# Patient Record
Sex: Female | Born: 1975 | Race: White | Hispanic: No | Marital: Married | Smoking: Never smoker
Health system: Southern US, Community
[De-identification: ages and names within clinical notes are randomized; demographics above are authoritative.]

## PROBLEM LIST (undated history)

## (undated) DIAGNOSIS — F329 Major depressive disorder, single episode, unspecified: Secondary | ICD-10-CM

## (undated) DIAGNOSIS — D649 Anemia, unspecified: Secondary | ICD-10-CM

## (undated) DIAGNOSIS — F3289 Other specified depressive episodes: Secondary | ICD-10-CM

## (undated) DIAGNOSIS — E7800 Pure hypercholesterolemia, unspecified: Secondary | ICD-10-CM

## (undated) DIAGNOSIS — K219 Gastro-esophageal reflux disease without esophagitis: Secondary | ICD-10-CM

## (undated) HISTORY — DX: Pure hypercholesterolemia, unspecified: E78.00

## (undated) HISTORY — PX: RHINOPLASTY: SUR1284

## (undated) HISTORY — PX: OTHER SURGICAL HISTORY: SHX169

## (undated) HISTORY — DX: Other specified depressive episodes: F32.89

## (undated) HISTORY — DX: Anemia, unspecified: D64.9

## (undated) HISTORY — DX: Major depressive disorder, single episode, unspecified: F32.9

## (undated) HISTORY — DX: Gastro-esophageal reflux disease without esophagitis: K21.9

---
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
## 2000-07-22 ENCOUNTER — Other Ambulatory Visit: Admission: RE | Admit: 2000-07-22 | Discharge: 2000-07-22 | Payer: Self-pay | Admitting: *Deleted

## 2000-10-22 ENCOUNTER — Encounter: Payer: Self-pay | Admitting: Family Medicine

## 2000-11-18 ENCOUNTER — Ambulatory Visit (HOSPITAL_COMMUNITY): Admission: RE | Admit: 2000-11-18 | Discharge: 2000-11-18 | Payer: Self-pay | Admitting: Gastroenterology

## 2000-11-18 ENCOUNTER — Encounter: Payer: Self-pay | Admitting: Family Medicine

## 2001-07-12 ENCOUNTER — Ambulatory Visit (HOSPITAL_COMMUNITY): Admission: RE | Admit: 2001-07-12 | Discharge: 2001-07-12 | Payer: Self-pay | Admitting: Family Medicine

## 2001-07-12 ENCOUNTER — Encounter: Payer: Self-pay | Admitting: Family Medicine

## 2001-08-17 ENCOUNTER — Other Ambulatory Visit: Admission: RE | Admit: 2001-08-17 | Discharge: 2001-08-17 | Payer: Self-pay | Admitting: *Deleted

## 2002-08-02 ENCOUNTER — Encounter: Admission: RE | Admit: 2002-08-02 | Discharge: 2002-08-02 | Payer: Self-pay | Admitting: Gastroenterology

## 2002-08-02 ENCOUNTER — Encounter: Payer: Self-pay | Admitting: Gastroenterology

## 2002-08-02 ENCOUNTER — Encounter: Payer: Self-pay | Admitting: Family Medicine

## 2002-09-16 ENCOUNTER — Other Ambulatory Visit: Admission: RE | Admit: 2002-09-16 | Discharge: 2002-09-16 | Payer: Self-pay | Admitting: *Deleted

## 2003-04-02 ENCOUNTER — Emergency Department (HOSPITAL_COMMUNITY): Admission: EM | Admit: 2003-04-02 | Discharge: 2003-04-02 | Payer: Self-pay | Admitting: Emergency Medicine

## 2003-09-27 ENCOUNTER — Other Ambulatory Visit: Admission: RE | Admit: 2003-09-27 | Discharge: 2003-09-27 | Payer: Self-pay | Admitting: *Deleted

## 2003-11-03 ENCOUNTER — Encounter: Payer: Self-pay | Admitting: Family Medicine

## 2005-03-01 ENCOUNTER — Encounter (INDEPENDENT_AMBULATORY_CARE_PROVIDER_SITE_OTHER): Payer: Self-pay | Admitting: Specialist

## 2005-03-01 ENCOUNTER — Inpatient Hospital Stay (HOSPITAL_COMMUNITY): Admission: AD | Admit: 2005-03-01 | Discharge: 2005-03-04 | Payer: Self-pay | Admitting: *Deleted

## 2007-09-22 ENCOUNTER — Inpatient Hospital Stay (HOSPITAL_COMMUNITY): Admission: RE | Admit: 2007-09-22 | Discharge: 2007-09-25 | Payer: Self-pay | Admitting: *Deleted

## 2008-02-10 ENCOUNTER — Encounter: Admission: RE | Admit: 2008-02-10 | Discharge: 2008-02-10 | Payer: Self-pay | Admitting: Family Medicine

## 2008-02-16 ENCOUNTER — Encounter: Admission: RE | Admit: 2008-02-16 | Discharge: 2008-02-16 | Payer: Self-pay | Admitting: Family Medicine

## 2008-12-05 ENCOUNTER — Ambulatory Visit: Payer: Self-pay | Admitting: Family Medicine

## 2008-12-05 DIAGNOSIS — K219 Gastro-esophageal reflux disease without esophagitis: Secondary | ICD-10-CM

## 2008-12-05 DIAGNOSIS — E7800 Pure hypercholesterolemia, unspecified: Secondary | ICD-10-CM

## 2008-12-05 DIAGNOSIS — Z8659 Personal history of other mental and behavioral disorders: Secondary | ICD-10-CM

## 2009-01-15 ENCOUNTER — Telehealth: Payer: Self-pay | Admitting: Family Medicine

## 2009-03-14 ENCOUNTER — Other Ambulatory Visit: Admission: RE | Admit: 2009-03-14 | Discharge: 2009-03-14 | Payer: Self-pay | Admitting: Family Medicine

## 2009-03-14 ENCOUNTER — Ambulatory Visit: Payer: Self-pay | Admitting: Family Medicine

## 2009-03-14 ENCOUNTER — Encounter: Payer: Self-pay | Admitting: Family Medicine

## 2009-03-19 LAB — CONVERTED CEMR LAB
ALT: 9 units/L (ref 0–35)
AST: 18 units/L (ref 0–37)
Alkaline Phosphatase: 63 units/L (ref 39–117)
Basophils Relative: 0.1 % (ref 0.0–3.0)
Bilirubin, Direct: 0 mg/dL (ref 0.0–0.3)
Chloride: 102 meq/L (ref 96–112)
Cholesterol: 207 mg/dL — ABNORMAL HIGH (ref 0–200)
Creatinine, Ser: 0.9 mg/dL (ref 0.4–1.2)
Eosinophils Relative: 1.1 % (ref 0.0–5.0)
Lymphocytes Relative: 35.1 % (ref 12.0–46.0)
MCV: 92.7 fL (ref 78.0–100.0)
Monocytes Absolute: 0.3 10*3/uL (ref 0.1–1.0)
Monocytes Relative: 6.5 % (ref 3.0–12.0)
Neutrophils Relative %: 57.2 % (ref 43.0–77.0)
RBC: 4.6 M/uL (ref 3.87–5.11)
Total CHOL/HDL Ratio: 4
Total Protein: 7.3 g/dL (ref 6.0–8.3)
VLDL: 16.6 mg/dL (ref 0.0–40.0)
WBC: 5.3 10*3/uL (ref 4.5–10.5)

## 2009-03-20 ENCOUNTER — Encounter (INDEPENDENT_AMBULATORY_CARE_PROVIDER_SITE_OTHER): Payer: Self-pay | Admitting: *Deleted

## 2009-06-14 ENCOUNTER — Ambulatory Visit: Payer: Self-pay | Admitting: Family Medicine

## 2009-07-24 ENCOUNTER — Ambulatory Visit: Payer: Self-pay | Admitting: Family Medicine

## 2009-07-26 ENCOUNTER — Telehealth: Payer: Self-pay | Admitting: Family Medicine

## 2009-08-29 ENCOUNTER — Ambulatory Visit: Payer: Self-pay | Admitting: Family Medicine

## 2009-12-13 ENCOUNTER — Telehealth: Payer: Self-pay | Admitting: Family Medicine

## 2010-01-28 ENCOUNTER — Ambulatory Visit: Payer: Self-pay | Admitting: Family Medicine

## 2010-03-12 ENCOUNTER — Telehealth (INDEPENDENT_AMBULATORY_CARE_PROVIDER_SITE_OTHER): Payer: Self-pay | Admitting: *Deleted

## 2010-03-13 ENCOUNTER — Ambulatory Visit: Payer: Self-pay | Admitting: Family Medicine

## 2010-03-17 LAB — CONVERTED CEMR LAB
ALT: 11 units/L (ref 0–35)
AST: 17 units/L (ref 0–37)
Albumin: 4.2 g/dL (ref 3.5–5.2)
Alkaline Phosphatase: 65 units/L (ref 39–117)
Eosinophils Relative: 1.4 % (ref 0.0–5.0)
GFR calc non Af Amer: 94.75 mL/min (ref >60)
HCT: 40.5 % (ref 36.0–46.0)
Hemoglobin: 14.1 g/dL (ref 12.0–15.0)
LDL Cholesterol: 113 mg/dL — ABNORMAL HIGH (ref 0–99)
Lymphocytes Relative: 23 % (ref 12.0–46.0)
Lymphs Abs: 1.6 10*3/uL (ref 0.7–4.0)
Monocytes Relative: 7.5 % (ref 3.0–12.0)
Neutro Abs: 4.6 10*3/uL (ref 1.4–7.7)
Potassium: 4.4 meq/L (ref 3.5–5.1)
Sodium: 139 meq/L (ref 135–145)
TSH: 1.15 microintl units/mL (ref 0.35–5.50)
Total Bilirubin: 0.7 mg/dL (ref 0.3–1.2)
Total CHOL/HDL Ratio: 4
VLDL: 19.8 mg/dL (ref 0.0–40.0)
WBC: 6.7 10*3/uL (ref 4.5–10.5)

## 2010-04-02 LAB — HM PAP SMEAR: HM Pap smear: NORMAL

## 2010-04-08 ENCOUNTER — Encounter: Payer: Self-pay | Admitting: Family Medicine

## 2010-04-16 ENCOUNTER — Ambulatory Visit: Payer: Self-pay | Admitting: Family Medicine

## 2010-04-24 ENCOUNTER — Ambulatory Visit: Payer: Self-pay | Admitting: Family Medicine

## 2010-04-24 ENCOUNTER — Other Ambulatory Visit
Admission: RE | Admit: 2010-04-24 | Discharge: 2010-04-24 | Payer: Self-pay | Source: Home / Self Care | Admitting: Family Medicine

## 2010-05-06 ENCOUNTER — Encounter: Payer: Self-pay | Admitting: Family Medicine

## 2010-07-02 NOTE — Progress Notes (Signed)
Summary: zoloft   Phone Note Call from Patient Call back at Home Phone [phone]   Caller: Patient Call For: Judith Part MD Summary of Call: Patient called to see if she could switch from prozac to zoloft. The prozac was removed from her med list on 07-24-09. She said that she stopped taking it because she didn't like the way if made her feel, (she says it was numming). She wants to try generic zoloft. [facility] [address]  Initial call taken by: Melody Comas,  December 13, 2009 10:46 AM  Follow-up for Phone Call        that is fine  px written on EMR for call in  please stop med if more depressed or anxious (this is rare) warn her this may be a bit more sedating than prozac f/u with me in about 6 wk Follow-up by: Judith Part MD,  December 13, 2009 12:41 PM  Additional Follow-up for Phone Call Additional follow up Details #1::        Patient notified as instructed by telephone. Medication phoned to [facility] [employer] as instructed. Pt scheduled appt with Dr Milinda Antis 01/28/10 at 3pm.[facility] LPN  December 13, 2009 12:54 PM     New/Updated Medications: ZOLOFT 50 MG TABS (SERTRALINE HCL) 1/2 by mouth once daily in eve for 2 weeks then increase to 1 by mouth once daily in eve Prescriptions: ZOLOFT 50 MG TABS (SERTRALINE HCL) 1/2 by mouth once daily in eve for 2 weeks then increase to 1 by mouth once daily in eve  #30 x 5   Entered and Authorized by:   Judith Part MD   Signed by:   Lewanda Rife LPN on 09/81/1914   Method used:   Telephoned to ...       [facility] [address] [phone]* (retail)       [address], [place]  [address]       Ph: [phone]       Fax: [phone]   RxID:   [id number]

## 2010-07-02 NOTE — Progress Notes (Signed)
Summary: Rx for cough  Phone Note Call from Patient Call back at Home Phone [phone] Call back at [phone] cell   Caller: Patient Call For: Judith Part MD Summary of Call: Patient was seen on Tuesday by Dr. Ermalene Searing and was told that if no better to call back and she would prescribe her a cough medicine.  Dr. Ermalene Searing is not here today but advised patient that I would send the note to her PCP to see if that could be taken care of.  Uses [facility] [phone]. Initial call taken by: Linde Gillis CMA Duncan Dull),  July 26, 2009 8:15 AM  Follow-up for Phone Call        looks like she gets nausea with codine so want to avoid that  px written on EMR for call in - tessalon let me know if not helpful or if symptoms worsen Follow-up by: Judith Part MD,  July 26, 2009 8:40 AM  Additional Follow-up for Phone Call Additional follow up Details #1::        Patient notified as instructed by telephone. Medication phoned to [employer] as instructed. Lewanda Rife LPN  July 26, 2009 11:29 AM     New/Updated Medications: TESSALON 200 MG CAPS (BENZONATATE) 1 by mouth three times a day as needed cough swallow whole Prescriptions: TESSALON 200 MG CAPS (BENZONATATE) 1 by mouth three times a day as needed cough swallow whole  #30 x 0   Entered and Authorized by:   Judith Part MD   Signed by:   Lewanda Rife LPN on 36/64/4034   Method used:   Telephoned to ...       [facility] [address] [phone]* (retail)       [address], [place]  [address]       Ph: [phone]       Fax: [phone]   RxID:   [id number]

## 2010-07-02 NOTE — Assessment & Plan Note (Signed)
Summary: CPX/W/PAP/RBH   Vital Signs:  Patient profile:   35 year old female Height:      69.25 inches Weight:      189.25 pounds BMI:     27.85 Temp:     98.4 degrees F oral Pulse rate:   92 / minute Pulse rhythm:   regular BP sitting:   114 / 70  (right arm) Cuff size:   regular  Vitals Entered By: Lewanda Rife LPN (April 24, 2010 3:07 PM) CC: CPX with pap and breast exam LMP 04/14/10   History of Present Illness: here for health mt exam with gyn exam and to disc chronic med problems   has been feeling good and taking care of herself   recent labs nl  no more anemia good lipids  LDL is 113 down from 139-- was working on eating better  not exercising -- cannot fit it in   nothing new to report   pink eye was gone the next day   bp 114/70  hx of breast ca in mother  nl pap 10/10  hx of hpv personal in past doing pap today  periods are irregular - but does not want OC  husband had a vasectomy   Td08  flu shot-- got at [facility] in october   Allergies: 1)  ! Codeine  Past History:  Past Medical History: Last updated: 12/05/2008 Anemia-NOS Depression GERD Genital Warts- HPV   GI- [facility]- Dr Earlene Plater -- [facility]   Past Surgical History: Last updated: 12/05/2008 Tonsillectomy (1985) colon flex sig 11/18/00- normal  9/09 neg abd [place]  3/04- neg hepatobiliary scan chin implant  rhinoplasty   Family History: Last updated: 12/05/2008 Family History of Alcoholism/Addiction (parent)- father  Family History of Arthritis (parent) Family History Breast cancer (parent)- mother  Family History of Colon CA  (grandparent)- MGF  Family History Diabetes (parent)- father  Family History High cholesterol (parent)- father  Family History Hypertension (parent)- father  Family History of Mental Ilness (Parent)- mother / depression  mother with degenerative arthritis   Social History: Last updated: 12/05/2008 Occupation:  Homemaker G2P2 Married Never Smoked Alcohol use-no Drug use-no Regular exercise-no  Risk Factors: Exercise: no (12/05/2008)  Risk Factors: Smoking Status: never (12/05/2008)  Review of Systems General:  Denies fatigue, fever, loss of appetite, and malaise. Eyes:  Denies blurring and eye irritation. CV:  Denies lightheadness and palpitations. Resp:  Denies cough, shortness of breath, and wheezing. GI:  Denies abdominal pain, change in bowel habits, and indigestion. GU:  Denies abnormal vaginal bleeding, discharge, dysuria, hematuria, and urinary frequency. MS:  Denies joint pain, joint redness, and joint swelling. Derm:  Denies itching, lesion(s), poor wound healing, and rash. Neuro:  Denies numbness and tingling. Psych:  Denies anxiety and depression. Endo:  Denies cold intolerance, excessive thirst, excessive urination, and heat intolerance. Heme:  Denies abnormal bruising and bleeding.  Physical Exam  General:  Well-developed,well-nourished,in no acute distress; alert,appropriate and cooperative throughout examination Head:  normocephalic, atraumatic, and no abnormalities observed.   Eyes:  vision grossly intact, pupils equal, pupils round, and pupils reactive to light.  no conjunctival pallor, injection or icterus  Ears:  R ear normal and L ear normal.   Nose:  no nasal discharge.   Mouth:  pharynx pink and moist.   Neck:  supple with full rom and no masses or thyromegally, no JVD or carotid bruit  Chest Wall:  No deformities, masses, or tenderness noted. Breasts:  No mass,  nodules, thickening, tenderness, bulging, retraction, inflamation, nipple discharge or skin changes noted.   Lungs:  Normal respiratory effort, chest expands symmetrically. Lungs are clear to auscultation, no crackles or wheezes. Heart:  Normal rate and regular rhythm. S1 and S2 normal without gallop, murmur, click, rub or other extra sounds. Abdomen:  Bowel sounds positive,abdomen soft and non-tender  without masses, organomegaly or hernias noted. no renal bruits  Genitalia:  Normal introitus for age, no external lesions, no vaginal discharge, mucosa pink and moist, no vaginal or cervical lesions, no vaginal atrophy, no friaility or hemorrhage, normal uterus size and position, no adnexal masses or tenderness Msk:  No deformity or scoliosis noted of thoracic or lumbar spine.  no acute joint changes  Pulses:  R and L carotid,radial,femoral,dorsalis pedis and posterior tibial pulses are full and equal bilaterally Extremities:  No clubbing, cyanosis, edema, or deformity noted with normal full range of motion of all joints.   Neurologic:  sensation intact to light touch, gait normal, and DTRs symmetrical and normal.   Skin:  Intact without suspicious lesions or rashes Cervical Nodes:  No lymphadenopathy noted Axillary Nodes:  No palpable lymphadenopathy Inguinal Nodes:  No significant adenopathy Psych:  normal affect, talkative and pleasant    Impression & Recommendations:  Problem # 1:  HEALTH MAINTENANCE EXAM (ICD-V70.0) Assessment Comment Only reviewed health habits including diet, exercise and skin cancer prevention reviewed health maintenance list and family history  rev labs with better cholesterol -- commended on that  utd on imms   Problem # 2:  ROUTINE GYNECOLOGICAL EXAMINATION (ICD-V72.31) Assessment: Comment Only annual exam with pap  menses are irregular - but pt is ok with that and no longer anemic  not in need of birth control   Problem # 3:  DEPRESSION (ICD-311) Assessment: Improved  overall doing well with this and zoloft  Her updated medication list for this problem includes:    Zoloft 50 Mg Tabs (Sertraline hcl) .Marland Kitchen... 1 by mouth once daily  Orders: Prescription Created Electronically [id number])  Complete Medication List: 1)  Zoloft 50 Mg Tabs (Sertraline hcl) .Marland Kitchen.. 1 by mouth once daily 2)  Multivitamins Tabs (Multiple vitamin) .... Take 1 tablet by mouth once a  day  Patient Instructions: 1)  keep up the good work with healthy diet  2)  work on incorporating exercise  3)  no change in medicines  Prescriptions: ZOLOFT 50 MG TABS (SERTRALINE HCL) 1 by mouth once daily  #30 x 11   Entered and Authorized by:   Judith Part MD   Signed by:   Judith Part MD on 04/24/2010   Method used:   Electronically to        [facility] (retail)       [address]       [place], [place]  [address]       Ph: [phone]       Fax: [phone]   RxID:   [id number]    Orders Added: 1)  Prescription Created Electronically [[id number]] 2)  Est. Patient 18-39 years [id number]    Current Allergies (reviewed today): ! CODEINE

## 2010-07-02 NOTE — Assessment & Plan Note (Signed)
Summary: 11:15AM - SORE THROAT & CONGESTION / LFW   Vital Signs:  Patient profile:   35 year old female Height:      69.25 inches Weight:      193.50 pounds BMI:     28.47 Temp:     99.0 degrees F oral Pulse rate:   72 / minute Pulse rhythm:   regular BP sitting:   108 / 76  (left arm) Cuff size:   regular  Vitals Entered By: Linde Gillis CMA Duncan Dull) (July 24, 2009 11:11 AM) CC: sore throat, congestion   Acute Visit History:      The patient complains of cough, earache, headache, and sore throat.  These symptoms began 5 days ago.  She denies fever and sinus problems.  Other comments include: chest congestion and tightness OTC using mucinex, nyquil, advil cold and sinus Children sick recently.        The character of the cough is described as nonproductive.  There is no history of wheezing or shortness of breath associated with her cough.        Earache symptom: left greater than right.        Problems Prior to Update: 1)  Other Acute Sinusitis  (ICD-461.8) 2)  Routine Gynecological Examination  (ICD-V72.31) 3)  Health Maintenance Exam  (ICD-V70.0) 4)  Hypercholesterolemia, Mild  (ICD-272.0) 5)  Sebaceous Cyst, Neck  (ICD-706.2) 6)  Gerd  (ICD-530.81) 7)  Depression  (ICD-311) 8)  Anemia-nos  (ICD-285.9) 9)  Family History Diabetes 1st Degree Relative  (ICD-V18.0) 10)  Family History of Colon Ca 1st Degree Relative <60  (ICD-V16.0) 11)  Family History Breast Cancer 1st Degree Relative <50  (ICD-V16.3) 12)  Family History of Alcoholism/addiction  (ICD-V61.41)  Current Medications (verified): 1)  None  Allergies: 1)  ! Codeine  Past History:  Past medical, surgical, family and social histories (including risk factors) reviewed, and no changes noted (except as noted below).  Past Medical History: Reviewed history from 12/05/2008 and no changes required. Anemia-NOS Depression GERD Genital Warts- HPV   GI- [facility]- Dr Earlene Plater -- [facility]   Past  Surgical History: Reviewed history from 12/05/2008 and no changes required. Tonsillectomy (1985) colon flex sig 11/18/00- normal  9/09 neg abd [place]  3/04- neg hepatobiliary scan chin implant  rhinoplasty   Family History: Reviewed history from 12/05/2008 and no changes required. Family History of Alcoholism/Addiction (parent)- father  Family History of Arthritis (parent) Family History Breast cancer (parent)- mother  Family History of Colon CA  (grandparent)- MGF  Family History Diabetes (parent)- father  Family History High cholesterol (parent)- father  Family History Hypertension (parent)- father  Family History of Mental Ilness (Parent)- mother / depression  mother with degenerative arthritis   Social History: Reviewed history from 12/05/2008 and no changes required. Occupation: Futures trader G2P2 Married Never Smoked Alcohol use-no Drug use-no Regular exercise-no  Review of Systems General:  Denies fatigue and fever. CV:  Denies chest pain or discomfort. GI:  Denies abdominal pain.  Physical Exam  General:  fatigued appearing female inNAD Ears:  clear fluid B TMS Nose:  nasal discharge, clear Mouth:  MMMpharynx pink and moist, no exudates, and no posterior lymphoid hypertrophy.   Neck:  no carotid bruit or thyromegaly no cervical or supraclavicular lymphadenopathy  Lungs:  Normal respiratory effort, chest expands symmetrically. Lungs are clear to auscultation, no crackles or wheezes. Heart:  Normal rate and regular rhythm. S1 and S2 normal without gallop, murmur, click, rub or other  extra sounds.   Impression & Recommendations:  Problem # 1:  URI (ICD-465.9)  Instructed on symptomatic treatment.  Mucinex, nsasl saline irrigation, call if cough suppressant needed. reviewed expected 7-10 day course of viral timeline. Call if symptoms persist or worsen.   Current Allergies (reviewed today): ! CODEINE

## 2010-07-02 NOTE — Letter (Signed)
Summary: Results Follow up Letter  [place] [facility]  [address] [place], [place] [address]   Phone: [phone]  Fax: [phone]    05/06/2010 MRN: [id number]    Margaret [name] [address] [place], [place]  [address]    Dear Ms. Gloeckner,  The following are the results of your recent test(s):  Test         Result    Pap Smear:        Normal __X___  Not Normal _____ Comments: ______________________________________________________ Cholesterol: LDL(Bad cholesterol):         Your goal is less than:         HDL (Good cholesterol):       Your goal is more than: Comments:  ______________________________________________________ Mammogram:        Normal _____  Not Normal _____ Comments:  ___________________________________________________________________ Hemoccult:        Normal _____  Not normal _______ Comments:    _____________________________________________________________________ Other Tests:    We routinely do not discuss normal results over the telephone.  If you desire a copy of the results, or you have any questions about this information we can discuss them at your next office visit.   Sincerely,    Idamae Schuller Tower,MD  MT/ri

## 2010-07-02 NOTE — Assessment & Plan Note (Signed)
Summary: 2:00 CONGESTION,COUGH/CLE   Vital Signs:  Patient profile:   35 year old female Height:      69.25 inches Weight:      192.13 pounds BMI:     28.27 Temp:     98.4 degrees F oral Pulse rate:   84 / minute Pulse rhythm:   regular BP sitting:   106 / 76  (left arm) Cuff size:   regular  Vitals Entered By: [name] CMA Duncan Dull) (June 14, 2009 2:01 PM) CC: Congestion   History of Present Illness: 35 yo with 2 weeks of nasal congestion, sinus pressure ears popping. she feels it has gotten worse over past few days. Trying OTC Advil cold and sinus with only mild relief of symptoms. Dry cough. No wheezing. No SOB. No rashes, no n/v/d.  Current Medications (verified): 1)  Prozac 20 Mg Caps (Fluoxetine Hcl) .... Take 1 Tablet By Mouth Once A Day 2)  Amoxicillin 500 Mg Tabs (Amoxicillin) .Marland Kitchen.. 1 Tab By Mouth Two Times A Day X 10 Days  Allergies: 1)  ! Codeine  Review of Systems      See HPI General:  Denies chills and fever. CV:  Denies chest pain or discomfort. Resp:  Denies shortness of breath, sputum productive, and wheezing.  Physical Exam  General:  NAD, sounds very congested. Nose:  nasal dischargemucosal pallor.   + frontal sinuses Mouth:  pharynx pink and moist.   Lungs:  Normal respiratory effort, chest expands symmetrically. Lungs are clear to auscultation, no crackles or wheezes. Heart:  Normal rate and regular rhythm. S1 and S2 normal without gallop, murmur, click, rub or other extra sounds. Psych:  normal affect, talkative and pleasant    Impression & Recommendations:  Problem # 1:  OTHER ACUTE SINUSITIS (ICD-461.8) Assessment New Given duration of symptoms, will treat for bacterial sinusitis. Continue supportive care.   See patient instructions for details. Her updated medication list for this problem includes:    Amoxicillin 500 Mg Tabs (Amoxicillin) .Marland Kitchen... 1 tab by mouth two times a day x 10 days  Complete Medication List: 1)  Prozac 20  Mg Caps (Fluoxetine hcl) .... Take 1 tablet by mouth once a day 2)  Amoxicillin 500 Mg Tabs (Amoxicillin) .Marland Kitchen.. 1 tab by mouth two times a day x 10 days  Patient Instructions: 1)   Drink lots of fluids.  Treat sympotmatically  with guafenesin, nasal saline irrigation, and Tylenol.  Cough suppressant at night. Call if not improving as expected in 5-7 days as on viral timeline.  Prescriptions: AMOXICILLIN 500 MG TABS (AMOXICILLIN) 1 tab by mouth two times a day x 10 days  #20 x 0   Entered and Authorized by:   Ruthe Mannan MD   Signed by:   Ruthe Mannan MD on 06/14/2009   Method used:   Electronically to        [facility]. [address] [phone]* (retail)       [address], [place]  [address]       Ph: [phone]       Fax: [phone]   RxID:   [id number]   Current Allergies (reviewed today): ! CODEINE

## 2010-07-02 NOTE — Miscellaneous (Signed)
Summary: Flu vaccine  Clinical Lists Changes  Observations: Added new observation of FLU VAX: Historical (04/04/2010 16:51)      Influenza Immunization History:    Influenza # 1:  Historical (04/04/2010) Received form from [employer] [place], [place]

## 2010-07-02 NOTE — Assessment & Plan Note (Signed)
Summary: ?Margaret [name]   Vital Signs:  Patient profile:   35 year old female Height:      69.25 inches Weight:      186.25 pounds BMI:     27.40 Temp:     98.6 degrees F oral Pulse rate:   84 / minute Pulse rhythm:   regular BP sitting:   106 / 70  (left arm) Cuff size:   regular  Vitals Entered By: Lewanda Rife LPN (April 16, 2010 8:28 AM) CC: ? Margaret eye in lt eye   History of Present Illness: L eye symptoms started 3 days ago  yesterday was blood red uncomfortable with pressure  not really itchy  a little bit of discharge in ams -- no crust sealing it shut  no swelling no vision trouble   works in [employer]- kids with colds her kids are not sick   a little post nasal drip and congestion - itchy throat   Allergies: 1)  ! Codeine  Past History:  Past Medical History: Last updated: 12/05/2008 Anemia-NOS Depression GERD Genital Warts- HPV   GI- [facility]- Dr Earlene Plater -- [facility]   Past Surgical History: Last updated: 12/05/2008 Tonsillectomy (1985) colon flex sig 11/18/00- normal  9/09 neg abd [place]  3/04- neg hepatobiliary scan chin implant  rhinoplasty   Family History: Last updated: 12/05/2008 Family History of Alcoholism/Addiction (parent)- father  Family History of Arthritis (parent) Family History Breast cancer (parent)- mother  Family History of Colon CA  (grandparent)- MGF  Family History Diabetes (parent)- father  Family History High cholesterol (parent)- father  Family History Hypertension (parent)- father  Family History of Mental Ilness (Parent)- mother / depression  mother with degenerative arthritis   Social History: Last updated: 12/05/2008 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 Eyes:   Complains of eye irritation, itching, and red eye; denies light sensitivity. ENT:  Complains of nasal congestion and postnasal drainage; denies sinus pressure and sore throat. CV:  Denies chest pain or discomfort, lightheadness, and palpitations. Resp:  Denies cough. GI:  Denies abdominal pain, indigestion, and nausea. MS:  Denies joint pain. Derm:  Denies rash. Neuro:  Denies headaches, numbness, and tingling.  Physical Exam  General:  Well-developed,well-nourished,in no acute distress; alert,appropriate and cooperative throughout examination Head:  normocephalic, atraumatic, and no abnormalities observed.  no sinus tenderness  Eyes:  vision grossly intact, pupils equal, pupils round, and pupils reactive to light.  L eye - notable conj injection with slt tearing  no swelling of eye or lid Ears:  R ear normal and L ear normal.   Nose:  nares are boggy and somewhat congested Mouth:  pharynx Margaret and moist.   Neck:  No deformities, masses, or tenderness noted. Lungs:  Normal respiratory effort, chest expands symmetrically. Lungs are clear to auscultation, no crackles or wheezes. Heart:  Normal rate and regular rhythm. S1 and S2 normal without gallop, murmur, click, rub or other extra sounds. Skin:  Intact without suspicious lesions or rashes Cervical Nodes:  No lymphadenopathy noted Psych:  normal affect, talkative and pleasant    Impression & Recommendations:  Problem # 1:  CONJUNCTIVITIS (ICD-372.30) Assessment New I suspect viral with some mild cold symptoms (and eye is improved from yesterday)  recommend sympt care- see pt instructions  -- hygiene/ cool comp  if worse will tx with cortisporin opthy solun and update  if vision change or pain adv to call asap   Complete Medication List: 1)  Tessalon 200 Mg Caps (Benzonatate) .Marland Kitchen.. 1 by mouth three times a day as needed cough swallow whole 2)  Clobetasol Propionate 0.05 % Oint (Clobetasol propionate) .... Apply to area two times a  day as needed 3)  Zoloft 50 Mg Tabs (Sertraline hcl) .... 1/2 by mouth once daily in eve for 2 weeks then increase to 1 by mouth once daily in eve 4)  Multivitamins Tabs (Multiple vitamin) .... Take 1 tablet by mouth once a day 5)  Cortisporin 3.5-10000-1 Soln (Neomycin-polymyxin-hc) .Marland Kitchen.. 1-2 drops in affected eye (s) three times a day  Patient Instructions: 1)  I think your conjunctivitis is viral -- it will get better on its own and is likely associated with a cold  2)  use cool compresses  3)  wipe away discharge with warm cloth  4)  if worse --pain /redness-- please fill and use cortisporin eye drops and update me  5)  if vision change or severe eye pain at any time - call  6)  please re schedule PE for any 30 minutes you can put together- thanks  Prescriptions: CORTISPORIN 3.5-10000-1 SOLN (NEOMYCIN-POLYMYXIN-HC) 1-2 drops in affected eye (s) three times a day  #1 bottle x 0   Entered and Authorized by:   Judith Part MD   Signed by:   Judith Part MD on 04/16/2010   Method used:   Print then Give to Patient   RxID:   [id number]    Orders Added: 1)  Est. Patient Level III [14782]    Current Allergies (reviewed today): ! CODEINE

## 2010-07-02 NOTE — Progress Notes (Signed)
----   Converted from flag ---- ---- 03/11/2010 6:52 PM, Colon Flattery Tower MD wrote: please draw wellness and lipids for v70.0 and 272- thanks   ---- 03/11/2010 4:30 PM, Melody Comas wrote: Patient is coming in for cpx labs tomorrow. What labs need to be drawn and diagnosis please. ------------------------------

## 2010-07-02 NOTE — Assessment & Plan Note (Signed)
Summary: ?RASH ON LEG,ARM,STOMACH/CLE   Vital Signs:  Patient profile:   35 year old female Weight:      195.50 pounds Temp:     98.8 degrees F oral Pulse rate:   88 / minute Pulse rhythm:   regular BP sitting:   104 / 74  (left arm) Cuff size:   regular  Vitals Entered By: Sydell Axon LPN (August 29, 2009 11:19 AM) CC: Rash on arms, legs and stomach, started about 6 days ago   History of Present Illness: Pt here for three to for areas of small rash she felt might be poison oak to start but then wonders about fleas. She has no particular exposure that is obvious but was outside in the woods just prior to the initial breakout on the right medial forearm. She now has other places. She initially treated the original lesion with Cortaid with worsening so has been using OTC antifungal but continued the steroid on the other place as they erupted. She has used prescription hydrocortisone for the last day (presumeably 2.5 % as she doesn't know and didn't bring it with her) from her sister. All the lesions itch significantly.She deines fever or chills and otherwise feels well with no other problems. She has never had poison ivy/oak of which she is aware.  Problems Prior to Update: 1)  Uri  (ICD-465.9) 2)  Other Acute Sinusitis  (ICD-461.8) 3)  Routine Gynecological Examination  (ICD-V72.31) 4)  Health Maintenance Exam  (ICD-V70.0) 5)  Hypercholesterolemia, Mild  (ICD-272.0) 6)  Sebaceous Cyst, Neck  (ICD-706.2) 7)  Gerd  (ICD-530.81) 8)  Depression  (ICD-311) 9)  Anemia-nos  (ICD-285.9) 10)  Family History Diabetes 1st Degree Relative  (ICD-V18.0) 11)  Family History of Colon Ca 1st Degree Relative <60  (ICD-V16.0) 12)  Family History Breast Cancer 1st Degree Relative <50  (ICD-V16.3) 13)  Family History of Alcoholism/addiction  (ICD-V61.41)  Medications Prior to Update: 1)  Tessalon 200 Mg Caps (Benzonatate) .Marland Kitchen.. 1 By Mouth Three Times A Day As Needed Cough Swallow Whole  Allergies: 1)   ! Codeine  Physical Exam  General:  fatigued appearing female inNAD Skin:  four maculopapular lesions , one approx 2cm on the right inner medial forearm (initial lesion) with central white outline, one left medial foream approx 1 cm, one right waist, 4mm., one lerft wrist 5mm. last three maculopapular in clusters, no central clearing.   Impression & Recommendations:  Problem # 1:  CONTACT DERMATITIS (ICD-692.9) Assessment New Trial of stronger steroid cream..Marland KitchenIf no impr RTC one week. Her updated medication list for this problem includes:    Clobetasol Propionate 0.05 % Oint (Clobetasol propionate) .Marland Kitchen... Apply to area two times a day  Complete Medication List: 1)  Tessalon 200 Mg Caps (Benzonatate) .Marland Kitchen.. 1 by mouth three times a day as needed cough swallow whole 2)  Clobetasol Propionate 0.05 % Oint (Clobetasol propionate) .... Apply to area two times a day Prescriptions: CLOBETASOL PROPIONATE 0.05 % OINT (CLOBETASOL PROPIONATE) apply to area two times a day  #15gm x 0   Entered and Authorized by:   Shaune Leeks MD   Signed by:   Shaune Leeks MD on 08/29/2009   Method used:   Electronically to        [facility] [address] [phone]* (retail)       [address] [place]  [address]       Ph: [phone]  Fax: [phone]   RxID:   [id number]

## 2010-07-02 NOTE — Assessment & Plan Note (Signed)
Summary: FOLLOW UP PER DR TOWER/RI   Vital Signs:  Patient profile:   35 year old female Height:      69.25 inches Weight:      186.75 pounds BMI:     27.48 Temp:     98.5 degrees F oral Pulse rate:   76 / minute Pulse rhythm:   regular BP sitting:   110 / 68  (left arm) Cuff size:   regular  Vitals Entered By: Lewanda Rife LPN (January 28, 2010 3:02 PM) CC: follow-up visit   History of Present Illness: here for f/u visit  called and was switched from prozac to zoloft really doing well with it  likes better than prozac mood is good overall   will be needing to change to paxil or celexa if needed  will be changing insurance to catastophic only-- hopefully only temporary   wt is down almost 10 lb good bp  is really watching what she is eating -- less cookies  is not exercising now   R knee hurts at times -- is crunchy  wants to know what exercise to do -- ? swimming    last chol high-- will re check in october      Allergies: 1)  ! Codeine  Past History:  Past Medical History: Last updated: 12/05/2008 Anemia-NOS Depression GERD Genital Warts- HPV   GI- [facility]- Dr Earlene Plater -- [facility]   Past Surgical History: Last updated: 12/05/2008 Tonsillectomy (1985) colon flex sig 11/18/00- normal  9/09 neg abd [place]  3/04- neg hepatobiliary scan chin implant  rhinoplasty   Family History: Last updated: 12/05/2008 Family History of Alcoholism/Addiction (parent)- father  Family History of Arthritis (parent) Family History Breast cancer (parent)- mother  Family History of Colon CA  (grandparent)- MGF  Family History Diabetes (parent)- father  Family History High cholesterol (parent)- father  Family History Hypertension (parent)- father  Family History of Mental Ilness (Parent)- mother / depression  mother with degenerative arthritis   Social History: Last updated: 12/05/2008 Occupation: Homemaker G2P2 Married Never Smoked Alcohol use-no Drug  use-no Regular exercise-no  Risk Factors: Exercise: no (12/05/2008)  Risk Factors: Smoking Status: never (12/05/2008)  Review of Systems General:  Denies fatigue, loss of appetite, and malaise. Eyes:  Denies blurring. CV:  Denies chest pain or discomfort, lightheadness, and palpitations. Resp:  Denies cough and shortness of breath. GI:  Denies abdominal pain, change in bowel habits, and nausea. MS:  Complains of joint pain; denies muscle aches and cramps. Derm:  Denies lesion(s), poor wound healing, and rash. Neuro:  Denies numbness and tingling. Psych:  Denies panic attacks and sense of great danger. Endo:  Denies excessive thirst and excessive urination. Heme:  Denies abnormal bruising and bleeding.  Physical Exam  General:  Well-developed,well-nourished,in no acute distress; alert,appropriate and cooperative throughout examination Head:  normocephalic, atraumatic, and no abnormalities observed.   Eyes:  vision grossly intact, pupils equal, pupils round, and pupils reactive to light.  no conjunctival pallor, injection or icterus  Mouth:  pharynx pink and moist.   Neck:  no carotid bruit or thyromegaly no cervical or supraclavicular lymphadenopathy  Lungs:  Normal respiratory effort, chest expands symmetrically. Lungs are clear to auscultation, no crackles or wheezes. Heart:  Normal rate and regular rhythm. S1 and S2 normal without gallop, murmur, click, rub or other extra sounds. Msk:  crepitice - bilat knees Pulses:  R and L carotid,radial,femoral,dorsalis pedis and posterior tibial pulses are full and equal bilaterally Extremities:  No clubbing, cyanosis, edema, or deformity noted with normal full range of motion of all joints.  left pretibial edema.   Neurologic:  sensation intact to light touch, gait normal, and DTRs symmetrical and normal.   Skin:  Intact without suspicious lesions or rashes Cervical Nodes:  No lymphadenopathy noted Psych:  normal affect, talkative and  pleasant    Impression & Recommendations:  Problem # 1:  DEPRESSION (ICD-311) Assessment Improved much improved with zoloft and less side eff will continue as long as she has ins - after that may need to change to celexa/ paxil - on the 4$ list  will disc further at f/u Her updated medication list for this problem includes:    Zoloft 50 Mg Tabs (Sertraline hcl) .Marland Kitchen... 1/2 by mouth once daily in eve for 2 weeks then increase to 1 by mouth once daily in eve  Problem # 2:  HYPERCHOLESTEROLEMIA, MILD (ICD-272.0) Assessment: Unchanged  with better diet expect imp will re check in oct and then follow up rev low sat fat diet   Labs Reviewed: SGOT: 18 (03/14/2009)   SGPT: 9 (03/14/2009)   HDL:53.90 (03/14/2009)  Chol:207 (03/14/2009)  Trig:83.0 (03/14/2009)  Complete Medication List: 1)  Tessalon 200 Mg Caps (Benzonatate) .Marland Kitchen.. 1 by mouth three times a day as needed cough swallow whole 2)  Clobetasol Propionate 0.05 % Oint (Clobetasol propionate) .... Apply to area two times a day as needed 3)  Zoloft 50 Mg Tabs (Sertraline hcl) .... 1/2 by mouth once daily in eve for 2 weeks then increase to 1 by mouth once daily in eve 4)  Multivitamins Tabs (Multiple vitamin) .... Take 1 tablet by mouth once a day  Patient Instructions: 1)  stay on zoloft for now- call me if you need to switch  2)  follow up in october as planned and for labs  3)  try bike for exercise   Current Allergies (reviewed today): ! CODEINE

## 2010-08-30 ENCOUNTER — Other Ambulatory Visit: Payer: Self-pay | Admitting: Family Medicine

## 2010-10-15 NOTE — Op Note (Signed)
Margaret [name], Margaret [name]              ACCOUNT NO.:  [id number]   MEDICAL RECORD NO.:  [id number]          PATIENT TYPE:  INP   LOCATION:  9199                          [facility]ILITY:  [facility]   PHYSICIAN:  Gerri Spore B. Earlene [name], M.D.  DATE OF BIRTH:  09-04-1975   DATE OF PROCEDURE:  09/22/2007  DATE OF DISCHARGE:                               OPERATIVE REPORT   PREOPERATIVE DIAGNOSES:  1. A 39-week intrauterine pregnancy.  2. Previous cesarean section.   POSTOPERATIVE DIAGNOSES:  1. A 39-week intrauterine pregnancy.  2. Previous cesarean section.   PROCEDURE:  Repeat low transverse C-section.   SURGEON:  Margaret [name], M.D.   ASSISTANT:  Marlinda Mike, C.N.M.   ANESTHESIA:  Spinal.   FINDINGS:  Viable female, vertex presentation, weight 9 pounds 11 ounces,  Apgars 9 and 9.  Nuchal cord x1.  Normal-appearing uterus, tubes and  ovaries.   SPECIMENS:  None.   COMPLICATIONS:  None.   BLOOD LOSS:  500.   INDICATIONS:  Patient with above history for repeat C-section, previous  LGA fetus with C-section for failure to descend.  The patient requests  elective repeat.  Risks of surgery discussed including infection,  bleeding, damage to surrounding organs.   PROCEDURE:  The patient taken to the operating room.  Spinal anesthesia  obtained.  She prepped and draped in standard fashion.  Foley catheter  inserted into the bladder.   A Pfannenstiel incision made.  Fascia divided sharply, elevated and the  underlying rectus muscles dissected off sharply.  Peritoneum elevated  and entered sharply.  Bladder blade inserted.  Bladder flap created  sharply.  Uterine incision made in low transverse fashion with a knife.  Extended laterally bluntly, vertex elevated to the incision and with  fundal pressure, could not be delivered.  Therefore the mushroom cup  vacuum was placed on the flexion point.  One pull from the mid green  zone, no pop-offs encountered.  Vertex easily delivered.  Nose and mouth  suctioned with the bulb.  Nuchal cord x1 easily reduced.  Remainder of  the infant delivered without difficulty.  Cord clamped and cut.  Infant  handed off to the awaiting pediatricians.  Ancef had been initiated  prior to the incision.   Placenta was removed by uterine massage.  Uterus cleared of all clots  and debris.  Uterine incision free of extension.  Closed in one layer  running lock stitch of 0 chromic.  Bleeder at the left angle made  hemostatic with two figure-of-eight stitches of the same suture.   Pelvis irrigated.  Uterine incision, bladder flap subfascial space were  all hemostatic.  Tubes and ovaries were normal.   Fascia was closed in a running stitch of 0 Vicryl.  Subcutaneous tissue  was irrigated and made hemostatic with a Bovie.  Skin was closed with  staples.   The patient tolerated the procedure well with no complications.  She was  taken to recovery room awake and alert in stable condition.  All counts  correct per the operating room staff.      Gerri Spore B. Earlene [name], M.D.  Electronically  Signed     WBD/MEDQ  D:  09/22/2007  T:  09/22/2007  Job:  [id number]

## 2010-10-18 NOTE — H&P (Signed)
Margaret [name], Margaret [name]              ACCOUNT NO.:  [id number]   MEDICAL RECORD NO.:  [id number]          PATIENT TYPE:  MAT   LOCATION:  MATC                          [facility]  [facility]   PHYSICIAN:  [place] B. Earlene Plater, M.D.  DATE OF BIRTH:  July 01, 1975   DATE OF ADMISSION:  03/02/2005  DATE OF DISCHARGE:                                HISTORY & PHYSICAL   ADMISSION DIAGNOSIS:  A 41-week intrauterine pregnancy.   HISTORY OF PRESENT ILLNESS:  A 36 year old white female gravida 1, para 0 at  41 weeks admitted for induction of labor for post dates.  Prenatal care at  [facility], Dr. Earlene Plater, uncomplicated other than suspected LGA fetus.  Ultrasound on February 26, 2005 showed estimated fetal weight of 4316 g and  polyhydramnios.   PAST MEDICAL HISTORY:  1.  IBS and reflux.  2.  Depression taking Prozac 20 mg daily.   PAST SURGICAL HISTORY:  Colonoscopy.   MEDICATIONS:  Prenatal vitamins and Prozac.   ALLERGIES:  CODEINE causes vomiting.   FAMILY HISTORY:  See prenatal record.   PRENATAL LABORATORIES:  See prenatal record.  Group B Strep is negative.  Patient is Rh negative as is her husband.   PHYSICAL EXAMINATION:  VITAL SIGNS:  Blood pressure 118/80, weight 231,  fetal heart tones 137.  GENERAL:  Alert and oriented, in no acute distress.  SKIN:  Warm, dry, no lesions.  HEART:  Regular rate and rhythm.  LUNGS:  Clear to auscultation.  ABDOMEN:  Gravid.  Fundal height 41 cm.  CERVIX:  1-2 cm, 50% effaced, -1 station, and vertex.   ASSESSMENT:  1.  Post dates pregnancy.  2.  LGA fetus.   Discussed with patient limitations of ultrasound estimated fetal weight and  that it could be higher or lower by as much as a pound.  Discussed induction  of labor as having potential increased risk for cesarean section, although  at 41 weeks the risk is probably equal to simply being [redacted] weeks pregnant in  spontaneous labor.  Recommend against pregnancy going much beyond this point  given  increased risk of stillbirth and other complications at 42 weeks.  Discussed small possibility of shoulder dystocia 5 or so percent and a less  than 1% of permanent neurological injury.  Discussed potential need for  cesarean section and the associated risks therein. Primary C-section  discussed as an alternative. Patient had decided after the above counseling  to wait for spontaneous labor until 41 weeks and at 41 weeks plan for  induction of labor.   PLAN:  Admission for induction of labor.      Gerri Spore B. Earlene Plater, M.D.  Electronically Signed     WBD/MEDQ  D:  02/28/2005  T:  02/28/2005  Job:  [id number]

## 2010-10-18 NOTE — Op Note (Signed)
[name], [name]              ACCOUNT NO.:  [id number]   MEDICAL RECORD NO.:  [id number]          PATIENT TYPE:  INP   LOCATION:  9116                          [facility]ILITY[facility]   PHYSICIAN:  Maxie Better, M.D.DATE OF BIRTH:  1976-02-07   DATE OF PROCEDURE:  03/01/2005  DATE OF DISCHARGE:                                 OPERATIVE REPORT   PREOPERATIVE DIAGNOSES:  1.  Arrest of descent.  2.  Nonreassuring fetal tracing.  3.  Post dates.   OPERATION/PROCEDURE:  Primary cesarean section Kerr hysterotomy.   POSTOPERATIVE DIAGNOSES:  1.  Arrest of descent.  2.  Right occiput posterior presentation.  3.  Polyhydramnios.  4.  Post dates.   ANESTHESIA:  Epidural.   SURGEON:  Maxie Better, M.D.   DESCRIPTION OF PROCEDURE:  Under adequate epidural anesthesia, the patient  was placed in the supine position with the left lateral tilt.  An indwelling  Foley catheter was already in place.  The patient was sterilely prepped and  draped in the usual fashion and 10 mL 0.25% Marcaine was injected at the  planned Pfannenstiel site.  Pfannenstiel skin incision was then made,  carried down to the rectus fascia.  Rectus fascia was incised in the  midline, extended bilaterally.  The rectus fascia was then bluntly and  sharply dissected off the rectus muscle in superior and inferior fashion.  The rectus muscle was split in the midline.  The parietal peritoneum was  entered bluntly and extended superiorly and inferiorly.  The vesicouterine  peritoneum then opened.  The bladder was bluntly dissected off the lower  uterine segment and displaced inferiorly.  A curvilinear low transverse  uterine incision was then made and extended bilaterally using the bandage  scissors.  A copious amount of meconium fluid was expressed.  The vertex was  wedged in the pelvis with hyperextended head in the right occiput posterior-  type presentation.  Subsequent manipulation resulted in the vertex being  delivered.  The baby was DeLee'd on the abdomen, bulb suctioned as well.  Cord was clamped and cut and the baby was transferred to the awaiting  pediatrician who assigned Apgars of 9 and 9 at one and five minutes.  Cord  PH was obtained.  The placenta was manually removed.  The uterus was  exteriorized.  The uterus was cleaned of debris.  Intracavitary  extramembranous tissue was removed.  The uterine incision which had no  extension was closed in two layers, the first layer with 0 Monocryl, running  locked stitch.  Second layer was imbricated using 0 Monocryl suture.  Normal  tubes were noted bilaterally.  Ovaries were normal.  The left ovary had some  paraovarian adhesions to the posterior aspect of the uterus which were  lysed.  The uterus was then returned to the abdomen. The abdominal cavity  was copiously irrigated and suctioned.  Small bleeders cauterized.  The  parietal peritoneum was not closed.  The rectus fascia was inspected. Small  bleeders were cauterized.  The rectus fascia was then closed with 0 Vicryl  x2.  The subcutaneous areas irrigated and  suctioned and the skin was  approximated using Ethicon staples.  Weight of the baby was 8  pounds 8 ounces.  Intraoperative fluid was 2200 mL crystalloid.  Urine  output was 150 mL concentrated urine.  Estimated blood loss was 800 mL.  Cord PH was 7.22.  There were no complications.  The patient tolerated the  procedure well and was transferred to the recovery room in stable condition.      Maxie Better, M.D.  Electronically Signed     [place]/MEDQ  D:  03/01/2005  T:  03/02/2005  Job:  [id number]

## 2010-10-18 NOTE — Discharge Summary (Signed)
NAMETHEORA, VANKIRK              ACCOUNT NO.:  [id number]   MEDICAL RECORD NO.:  [id number]          PATIENT TYPE:  INP   LOCATION:  9110                          [facility]   PHYSICIAN:  Gerri Spore B. Earlene Plater, M.D.  DATE OF BIRTH:  1976-03-28   DATE OF ADMISSION:  09/22/2007  DATE OF DISCHARGE:  09/25/2007                               DISCHARGE SUMMARY   ADMISSION DIAGNOSES:  1. A 39-week intrauterine pregnancy.  2. Previous cesarean section.   DISCHARGE DIAGNOSES:  1. A 39-week intrauterine pregnancy.  2. Previous cesarean section.   PROCEDURE:  Repeat low-transverse C-section.   HISTORY OF PRESENT ILLNESS:  For details, please see the history and  physical in the chart.  Briefly, the patient presented at 39 weeks with  history of previous C-section for repeat hospital course.  The patient  was then underwent repeat low transverse C-section.  Findings at time of  surgery included viable female, weight was 9 pounds 11 ounces, Apgars 9  and 9, nuchal cord x1.  Uterus, tubes, and ovaries appeared normal.   Postoperatively, the patient rapidly regained her ability to ambulate,  void, and tolerate a regular diet.  She was discharged home on the third  postoperative day in satisfactory condition.   DISCHARGE MEDICATIONS:  1. Prozac 40 mg p.o. daily.  2. Darvocet-N 100 one to two p.r.n. pain.   DISCHARGE INSTRUCTIONS:  Per booklet.  Followup in 6 weeks.   DISPOSITION:  Discharge satisfactorily.      Gerri Spore B. Earlene Plater, M.D.  Electronically Signed     WBD/MEDQ  D:  10/06/2007  T:  10/07/2007  Job:  [id number]

## 2010-10-18 NOTE — Discharge Summary (Signed)
Margaret [name], Margaret [name]              ACCOUNT NO.:  [id number]   MEDICAL RECORD NO.:  [id number]          PATIENT TYPE:  INP   LOCATION:  9116                          [facility]   PHYSICIAN:  Maxie Better, M.D.DATE OF BIRTH:  [birth year]   DATE OF ADMISSION:  03/01/2005  DATE OF DISCHARGE:  03/04/2005                                 DISCHARGE SUMMARY   ADMISSION DIAGNOSES:  1.  Post dates.  2.  Early labor.  3.  Depression.   DISCHARGE DIAGNOSES:  1.  Post dates, delivered.  2.  Depression.  3.  Arrest of descent.  4.  Acute chorioamnionitis.  5.  Non-reassuring fetal tracing.   OPERATION/PROCEDURE:  Primary cesarean section, Kerr hysterotomy.   HISTORY OF PRESENT ILLNESS:  A 35 year old gravida 1, para 0, female at 8  weeks, admitted in labor on March 01, 2005.  The patient's history is  noted for depression controlled with Prozac.  Last ultrasound per patient  had a estimated fetal weight of about 9 pounds.   HOSPITAL COURSE:  On presentation, the patient had cervical exam of 4, 50%,  -3. She was contracting every three to four minutes. The tracing was non-  reactive.  The patient was admitted.  Epidural anesthesia was given.  Pitocin augmentation was started.  She had artificial rupture of membranes.  Medium meconium fluid was noted.  An intrauterine pressure catheter was  placed.  Amnio infusion was started.  She was 5, 60-70%, -2, vertex  presentation at that time.  The patient was documented to have suboptimal  contractions.  Pitocin was continued.  Internal scalp electrode was placed  secondary to variable and early decelerations that were noted with each  contraction.  The patient progressed to full dilatation and was pushing very  well.  Fetal heart rate was decreasing to the 80's with each contraction.  Fetal heart baseline decreased to 110's.  Her cervix was fully, 0-+1 station  with an asynclitic left occiput posterior presentation.  Copious  meconium  fluid was noted.  No descent was noted.  After a short while, Given the  suspicion for a large-for-gestational-age baby, recommendation was made for  primary cesarean section due to the fact that delivery was not imminent.  The patient concurred.   She was taken to the operating room where she underwent a primary cesarean  section.  The patient procedure was resulted in the delivery of a live female,  with a hyperextended head in the right occiput posterior presentation.  His  weight was 8 pounds 8 ounces.  Cord PH was 7.22.  Apgars was 9 and 9.  Polyhydramnios was noted at the time of the delivery.  Placenta was sent to  pathology, confirmed acute chorioamnionitis and meconium-stained fluid.  The  patient was RH negative.  The baby was O negative and, therefore, RhoGAM was  not given.   Postoperatively the patient did not spike a temperature.  She had an  uncomplicated postoperative course.  A CBC on postoperative day #1 showed a  hemoglobin of 9.9, hematocrit 28.9, white count 16.4, platelet count  213,000.  By  postoperative day #3, the patient's pain was well-controlled.  She was passing flatus and tolerating regular diet. Was deemed ready to be  discharged home given her incision showed no evidence of infection.   DISPOSITION:  Home.   CONDITION ON DISCHARGE:  Stable.   DISCHARGE MEDICATIONS:  1.  Tylox one to two tablets every four to six hours p.r.n. pain.  2.  Ferrous sulfate 325 mg one p.o. b.i.d.  3.  Continue her Prozac.  4.  Prenatal vitamins one p.o. daily.Marland Kitchen   DISCHARGE INSTRUCTIONS:  Per postpartum booklet given.   FOLLOW UP:  In four to six weeks at [facility].      Maxie Better, M.D.  Electronically Signed     [place]/MEDQ  D:  05/11/2005  T:  05/12/2005  Job:  [id number]

## 2010-10-18 NOTE — Procedures (Signed)
[place]  Patient:    Margaret [name], Margaret [name]                       MRN: [id number] Proc. Date: 11/18/00 Adm. Date:  [id number] Attending:  Charna Annebelle CC:         Marina Gravel, M.D.   Procedure Report  PROCEDURE PERFORMED:  Flexible sigmoidoscopy up to 60 cm.  ENDOSCOPIST:  Anselmo Rod, M.D.  INSTRUMENT USED:  Olympus video colonoscope.  INDICATIONS FOR PROCEDURE:  The patient is a 35 year old white female with severe left lower quadrant discomfort and change in bowel habits, rule out colitis, hemorrhoids, etc.  PREPROCEDURE PREPARATION:  Informed consent was procured from the patient. The patient was fasted for eight hours prior to the procedure and prepped with two Fleets enemas the morning of the procedure.  PREPROCEDURE PHYSICAL:  The patient had stable vital signs.  Neck supple. Chest clear to auscultation.  S1 and S2 regular.  Abdomen soft with normal bowel sounds.  DESCRIPTION OF PROCEDURE:  The patient was placed in left lateral decubitus position.  No sedation was used.  Once the patient was adequately positioned, the Olympus video colonoscope was advanced into the rectum to 60 cm without difficulty.  A small erythematous patch was seen at 10 cm of unclear significance.  This did not seem typical of an erosion.  The entire colonic mucosa up to 60 cm appeared normal.  The patient had no other abnormalities appreciated in the left colon up to the splenic flexure.  RECOMMENDATIONS: 1. High fiber diet. 2. Outpatient follow-up in the next four weeks. DD:  11/18/00 TD:  11/18/00 Job: [id number]

## 2010-11-19 HISTORY — PX: FLEXIBLE SIGMOIDOSCOPY: SHX1649

## 2010-11-19 HISTORY — PX: COLONOSCOPY: SHX174

## 2011-02-25 LAB — DIFFERENTIAL
Basophils Relative: 1
Eosinophils Absolute: 0.1
Eosinophils Relative: 1
Lymphs Abs: 2.1
Monocytes Absolute: 0.6
Monocytes Relative: 5
Neutrophils Relative %: 78 — ABNORMAL HIGH

## 2011-02-25 LAB — RH IMMUNE GLOB WKUP(>/=20WKS)(NOT WOMEN'S HOSP)

## 2011-02-25 LAB — CBC
HCT: 29.1 — ABNORMAL LOW
HCT: 35.7 — ABNORMAL LOW
MCHC: 35.2
MCHC: 35.6
MCV: 92.4
MCV: 93.1
Platelets: 180
RBC: 3.86 — ABNORMAL LOW
RDW: 13.8
WBC: 13 — ABNORMAL HIGH

## 2011-04-08 ENCOUNTER — Telehealth: Payer: Self-pay | Admitting: Family Medicine

## 2011-04-08 DIAGNOSIS — E7800 Pure hypercholesterolemia, unspecified: Secondary | ICD-10-CM

## 2011-04-08 DIAGNOSIS — Z0000 Encounter for general adult medical examination without abnormal findings: Secondary | ICD-10-CM | POA: Insufficient documentation

## 2011-04-08 NOTE — Telephone Encounter (Signed)
Message copied by Judy Pimple on Tue Apr 08, 2011  8:37 PM ------      Message from: Baldomero Lamy      Created: Mon Apr 07, 2011  1:33 PM      Regarding: Cpx labs Thurs 11/08       Please order  future cpx labs for pt's upcomming lab appt.      Thanks      Rodney Booze

## 2011-04-09 ENCOUNTER — Other Ambulatory Visit: Payer: Self-pay

## 2011-04-10 ENCOUNTER — Other Ambulatory Visit (INDEPENDENT_AMBULATORY_CARE_PROVIDER_SITE_OTHER): Payer: BC Managed Care – PPO

## 2011-04-10 DIAGNOSIS — E7800 Pure hypercholesterolemia, unspecified: Secondary | ICD-10-CM

## 2011-04-10 DIAGNOSIS — Z0000 Encounter for general adult medical examination without abnormal findings: Secondary | ICD-10-CM

## 2011-04-10 LAB — LIPID PANEL
Cholesterol: 197 mg/dL (ref 0–200)
LDL Cholesterol: 119 mg/dL — ABNORMAL HIGH (ref 0–99)
Total CHOL/HDL Ratio: 3
Triglycerides: 99 mg/dL (ref 0.0–149.0)
VLDL: 19.8 mg/dL (ref 0.0–40.0)

## 2011-04-10 LAB — COMPREHENSIVE METABOLIC PANEL
AST: 17 U/L (ref 0–37)
Albumin: 4.8 g/dL (ref 3.5–5.2)
Alkaline Phosphatase: 58 U/L (ref 39–117)
Glucose, Bld: 84 mg/dL (ref 70–99)
Potassium: 4.1 mEq/L (ref 3.5–5.1)
Sodium: 140 mEq/L (ref 135–145)
Total Bilirubin: 0.7 mg/dL (ref 0.3–1.2)
Total Protein: 7.5 g/dL (ref 6.0–8.3)

## 2011-04-10 LAB — CBC WITH DIFFERENTIAL/PLATELET
Eosinophils Absolute: 0.1 10*3/uL (ref 0.0–0.7)
Eosinophils Relative: 1.3 % (ref 0.0–5.0)
HCT: 42 % (ref 36.0–46.0)
Lymphs Abs: 1.8 10*3/uL (ref 0.7–4.0)
MCHC: 34.7 g/dL (ref 30.0–36.0)
MCV: 90.1 fl (ref 78.0–100.0)
Monocytes Absolute: 0.4 10*3/uL (ref 0.1–1.0)
Neutrophils Relative %: 50.1 % (ref 43.0–77.0)
Platelets: 256 10*3/uL (ref 150.0–400.0)
RDW: 12.4 % (ref 11.5–14.6)

## 2011-04-15 ENCOUNTER — Encounter: Payer: Self-pay | Admitting: Family Medicine

## 2011-04-16 ENCOUNTER — Ambulatory Visit (INDEPENDENT_AMBULATORY_CARE_PROVIDER_SITE_OTHER): Payer: BC Managed Care – PPO | Admitting: Family Medicine

## 2011-04-16 ENCOUNTER — Other Ambulatory Visit (HOSPITAL_COMMUNITY)
Admission: RE | Admit: 2011-04-16 | Discharge: 2011-04-16 | Disposition: A | Discharge status: Home / Self Care | Payer: BC Managed Care – PPO | Source: Ambulatory Visit | Attending: Family Medicine | Admitting: Family Medicine

## 2011-04-16 ENCOUNTER — Encounter: Payer: Self-pay | Admitting: Family Medicine

## 2011-04-16 VITALS — BP 116/70 | HR 76 | Temp 98.2°F | Ht 69.0 in | Wt 177.2 lb

## 2011-04-16 DIAGNOSIS — E7800 Pure hypercholesterolemia, unspecified: Secondary | ICD-10-CM

## 2011-04-16 DIAGNOSIS — F329 Major depressive disorder, single episode, unspecified: Secondary | ICD-10-CM

## 2011-04-16 DIAGNOSIS — Z0000 Encounter for general adult medical examination without abnormal findings: Secondary | ICD-10-CM

## 2011-04-16 DIAGNOSIS — Z01419 Encounter for gynecological examination (general) (routine) without abnormal findings: Secondary | ICD-10-CM | POA: Insufficient documentation

## 2011-04-16 NOTE — Patient Instructions (Signed)
Keep up the good work with diet and weight loss  Add exercise when you can - it helps energy levels and overall well being  Labs ok but cholesterol up a bit (Avoid red meat/ fried foods/ egg yolks/ fatty breakfast meats/ butter, cheese and high fat dairy/ and shellfish  ) Do self breast exams monthly

## 2011-04-16 NOTE — Assessment & Plan Note (Signed)
Annual exam with pap  Remote hx of hpv  No problems husb had vasectomy - so no need for contraception

## 2011-04-16 NOTE — Assessment & Plan Note (Signed)
Doing well with zoloft No changes  Will continue current dose

## 2011-04-16 NOTE — Progress Notes (Signed)
Subjective:    Patient ID: Margaret [name], female    DOB: May 15, 1976, 35 y.o.   MRN: [id number]  HPI Here for annual health mt exam and gyn care , also to review chronic med problems  bp good at 116/70 Wt down 12 lb with bmi of 26 Is watching what she eats  Still needs to exercise -- does not have a plan- not really motivated, wants to join a gym Is busy with a 33 year old    Lab Results  Component Value Date   CHOL 197 04/10/2011   CHOL 184 03/13/2010   CHOL 207* 03/14/2009   Lab Results  Component Value Date   HDL 58.30 04/10/2011   HDL 86.57 03/13/2010   HDL 53.90 03/14/2009   Lab Results  Component Value Date   LDLCALC 119* 04/10/2011   LDLCALC 113* 03/13/2010   Lab Results  Component Value Date   TRIG 99.0 04/10/2011   TRIG 99.0 03/13/2010   TRIG 83.0 03/14/2009   Lab Results  Component Value Date   CHOLHDL 3 04/10/2011   CHOLHDL 4 03/13/2010   CHOLHDL 4 03/14/2009   Lab Results  Component Value Date   LDLDIRECT 139.3 03/14/2009   LDL up but HDL up a bit too Diet -does not eat any red meat , or fried foods- but pays a bit less attention    Pap nl 11/11 Hx of HPV Menses - pretty normal - they are very heavy -- but does not do well with OC , tolerates it  Not a lot of cramping  Lab Results  Component Value Date   WBC 4.6 04/10/2011   HGB 14.6 04/10/2011   HCT 42.0 04/10/2011   MCV 90.1 04/10/2011   PLT 256.0 04/10/2011     Depression - on zoloft - is working well   Mother had breast cancer Self exam -- no lumps or changes   Td 08 Had her flu shot this season   Patient Active Problem List  Diagnoses  . HYPERCHOLESTEROLEMIA, MILD  . DEPRESSION  . GERD  . Routine general medical examination at a health care facility  . Gynecological examination   Past Medical History  Diagnosis Date  . Depressive disorder, not elsewhere classified   . Esophageal reflux   . Pure hypercholesterolemia   . Anemia   . HPV-genital warts    Past Surgical History    Procedure Date  . Tonsillectomy 1985  . Colonoscopy 11/19/10    normal  . Flexible sigmoidoscopy 11/19/10    normal  . Chin implant   . Rhinoplasty    History  Substance Use Topics  . Smoking status: Never Smoker   . Smokeless tobacco: Not on file  . Alcohol Use: No   Family History  Problem Relation Age of Onset  . Alcohol abuse Father   . Arthritis      parent  . Breast cancer Mother     < 50  . Colon cancer Maternal Grandfather     < 60  . Diabetes Father   . Hyperlipidemia Father   . Hypertension Father   . Depression Mother   . Arthritis Mother     Degenerative   Allergies  Allergen Reactions  . Codeine     REACTION: nausea   Current Outpatient Prescriptions on File Prior to Visit  Medication Sig Dispense Refill  . Multiple Vitamin (MULTIVITAMIN) tablet Take 1 tablet by mouth daily.        . sertraline (ZOLOFT) 50  MG tablet Take 50 mg by mouth daily.               Review of Systems Review of Systems  Constitutional: Negative for fever, appetite change, fatigue and unexpected weight change.  Eyes: Negative for pain and visual disturbance.  Respiratory: Negative for cough and shortness of breath.   Cardiovascular: Negative for cp or palpitations    Gastrointestinal: Negative for nausea, diarrhea and constipation.  Genitourinary: Negative for urgency and frequency.  Skin: Negative for pallor or rash   Neurological: Negative for weakness, light-headedness, numbness and headaches.  Hematological: Negative for adenopathy. Does not bruise/bleed easily.  Psychiatric/Behavioral: Negative for dysphoric mood. The patient is not nervous/anxious.          Objective:   Physical Exam  Constitutional: She appears well-developed and well-nourished. No distress.  HENT:  Head: Normocephalic and atraumatic.  Right Ear: External ear normal.  Left Ear: External ear normal.  Nose: Nose normal.  Mouth/Throat: Oropharynx is clear and moist.  Eyes: Conjunctivae and  EOM are normal. Pupils are equal, round, and reactive to light.  Neck: Normal range of motion. Neck supple. No JVD present. Carotid bruit is not present. No thyromegaly present.  Cardiovascular: Normal rate, regular rhythm, normal heart sounds and intact distal pulses.  Exam reveals no gallop.   Pulmonary/Chest: Effort normal and breath sounds normal. No respiratory distress. She has no wheezes.  Abdominal: Soft. Bowel sounds are normal. She exhibits no distension, no abdominal bruit and no mass. There is no tenderness.  Genitourinary: Vagina normal and uterus normal. No breast swelling, tenderness, discharge or bleeding. No vaginal discharge found.  Musculoskeletal: Normal range of motion. She exhibits no edema and no tenderness.  Lymphadenopathy:    She has no cervical adenopathy.  Neurological: She is alert. She has normal reflexes. No cranial nerve deficit. She exhibits normal muscle tone. Coordination normal.  Skin: Skin is warm and dry. No rash noted. No erythema. No pallor.  Psychiatric: She has a normal mood and affect.          Assessment & Plan:

## 2011-04-16 NOTE — Assessment & Plan Note (Signed)
Reviewed health habits including diet and exercise and skin cancer prevention Also reviewed health mt list, fam hx and immunizations  Rev wellness labs in detail 

## 2011-04-16 NOTE — Assessment & Plan Note (Signed)
Disc goals for lipids and reasons to control them Rev labs with pt Rev low sat fat diet in detail  LDL and hdl are both up slightly

## 2011-04-28 ENCOUNTER — Other Ambulatory Visit: Payer: Self-pay | Admitting: Family Medicine

## 2011-04-28 ENCOUNTER — Encounter: Payer: Self-pay | Admitting: *Deleted

## 2011-04-28 NOTE — Telephone Encounter (Signed)
Px written for call in   

## 2011-04-28 NOTE — Telephone Encounter (Signed)
[facility] request refill sertraline 50 mg. Last filled 11/13/10 and pt last seen 04/16/11.

## 2011-04-29 NOTE — Telephone Encounter (Signed)
Rx called in as directed.   

## 2011-06-24 ENCOUNTER — Encounter: Payer: Self-pay | Admitting: Family Medicine

## 2011-12-02 ENCOUNTER — Ambulatory Visit: Payer: BC Managed Care – PPO | Admitting: Family Medicine

## 2012-03-22 ENCOUNTER — Other Ambulatory Visit: Payer: Self-pay | Admitting: Family Medicine

## 2012-03-22 NOTE — Telephone Encounter (Signed)
Go ahead and fill for a month- she has upcoming appt with me next mo, thanks

## 2012-03-22 NOTE — Telephone Encounter (Signed)
Ok to refill 

## 2012-04-11 ENCOUNTER — Telehealth: Payer: Self-pay | Admitting: Family Medicine

## 2012-04-11 DIAGNOSIS — E7800 Pure hypercholesterolemia, unspecified: Secondary | ICD-10-CM

## 2012-04-11 DIAGNOSIS — Z0000 Encounter for general adult medical examination without abnormal findings: Secondary | ICD-10-CM

## 2012-04-11 NOTE — Telephone Encounter (Signed)
Message copied by Judy Pimple on Sun Apr 11, 2012  6:34 PM ------      Message from: Baldomero Lamy      Created: Fri Apr 02, 2012 10:34 AM      Regarding: Cpx labs Mon 11/11       Please order  future cpx labs for pt's upcomming lab appt.      Thanks      Rodney Booze

## 2012-04-12 ENCOUNTER — Other Ambulatory Visit (INDEPENDENT_AMBULATORY_CARE_PROVIDER_SITE_OTHER): Payer: BC Managed Care – PPO

## 2012-04-12 DIAGNOSIS — E7800 Pure hypercholesterolemia, unspecified: Secondary | ICD-10-CM

## 2012-04-12 DIAGNOSIS — Z0000 Encounter for general adult medical examination without abnormal findings: Secondary | ICD-10-CM

## 2012-04-12 LAB — CBC WITH DIFFERENTIAL/PLATELET
Basophils Absolute: 0 10*3/uL (ref 0.0–0.1)
Eosinophils Absolute: 0.1 10*3/uL (ref 0.0–0.7)
HCT: 42.4 % (ref 36.0–46.0)
Lymphs Abs: 1.6 10*3/uL (ref 0.7–4.0)
MCV: 92 fl (ref 78.0–100.0)
Monocytes Absolute: 0.4 10*3/uL (ref 0.1–1.0)
Neutrophils Relative %: 62.8 % (ref 43.0–77.0)
Platelets: 233 10*3/uL (ref 150.0–400.0)
RDW: 12.7 % (ref 11.5–14.6)

## 2012-04-12 LAB — LIPID PANEL
Cholesterol: 191 mg/dL (ref 0–200)
LDL Cholesterol: 115 mg/dL — ABNORMAL HIGH (ref 0–99)
Total CHOL/HDL Ratio: 3
VLDL: 19 mg/dL (ref 0.0–40.0)

## 2012-04-12 LAB — COMPREHENSIVE METABOLIC PANEL
ALT: 12 U/L (ref 0–35)
AST: 17 U/L (ref 0–37)
Alkaline Phosphatase: 53 U/L (ref 39–117)
Potassium: 4.4 mEq/L (ref 3.5–5.1)
Sodium: 140 mEq/L (ref 135–145)
Total Bilirubin: 0.8 mg/dL (ref 0.3–1.2)
Total Protein: 7.4 g/dL (ref 6.0–8.3)

## 2012-04-12 LAB — TSH: TSH: 0.77 u[IU]/mL (ref 0.35–5.50)

## 2012-04-14 ENCOUNTER — Encounter: Payer: BC Managed Care – PPO | Admitting: Family Medicine

## 2012-04-21 ENCOUNTER — Encounter: Payer: Self-pay | Admitting: Family Medicine

## 2012-04-21 ENCOUNTER — Other Ambulatory Visit (HOSPITAL_COMMUNITY)
Admission: RE | Admit: 2012-04-21 | Discharge: 2012-04-21 | Disposition: A | Discharge status: Home / Self Care | Payer: BC Managed Care – PPO | Source: Ambulatory Visit | Attending: Family Medicine | Admitting: Family Medicine

## 2012-04-21 ENCOUNTER — Ambulatory Visit (INDEPENDENT_AMBULATORY_CARE_PROVIDER_SITE_OTHER): Payer: BC Managed Care – PPO | Admitting: Family Medicine

## 2012-04-21 VITALS — BP 122/88 | HR 78 | Temp 98.8°F | Ht 69.25 in | Wt 176.8 lb

## 2012-04-21 DIAGNOSIS — Z0000 Encounter for general adult medical examination without abnormal findings: Secondary | ICD-10-CM

## 2012-04-21 DIAGNOSIS — Z01419 Encounter for gynecological examination (general) (routine) without abnormal findings: Secondary | ICD-10-CM

## 2012-04-21 DIAGNOSIS — E7800 Pure hypercholesterolemia, unspecified: Secondary | ICD-10-CM

## 2012-04-21 DIAGNOSIS — Z1151 Encounter for screening for human papillomavirus (HPV): Secondary | ICD-10-CM | POA: Insufficient documentation

## 2012-04-21 DIAGNOSIS — Z23 Encounter for immunization: Secondary | ICD-10-CM

## 2012-04-21 NOTE — Assessment & Plan Note (Addendum)
Pap and exam today Has some skin tags and old genital warts externally Pt had negative genetic testing for breast cancer which is very reassuring

## 2012-04-21 NOTE — Progress Notes (Signed)
Subjective:    Patient ID: Margaret [name], female    DOB: June 05, 1975, 36 y.o.   MRN: [id number]  HPI Here for health maintenance exam and to review chronic medical problems    Doing well overall  Wt is stable with bmi of 25.9  Flu vaccine- wants that today   Pap 11/12 Hx of HPV with genital warts in the past  Needs pap smear  No gyn problems  Periods are heavy/ long/ painful- does not to go on OC  Her partner had vasectomy  Self breast exam- no lumps or changes  Mother had b ca  She has had the genetic test - did not have it - very fortunate   Lab Results  Component Value Date   CHOL 191 04/12/2012   CHOL 197 04/10/2011   CHOL 184 03/13/2010   Lab Results  Component Value Date   HDL 57.20 04/12/2012   HDL 58.30 04/10/2011   HDL 62.13 03/13/2010   Lab Results  Component Value Date   LDLCALC 115* 04/12/2012   LDLCALC 119* 04/10/2011   LDLCALC 113* 03/13/2010   Lab Results  Component Value Date   TRIG 95.0 04/12/2012   TRIG 99.0 04/10/2011   TRIG 99.0 03/13/2010   Lab Results  Component Value Date   CHOLHDL 3 04/12/2012   CHOLHDL 3 04/10/2011   CHOLHDL 4 03/13/2010   Lab Results  Component Value Date   LDLDIRECT 139.3 03/14/2009   no real changes from last year Watches diet somewhat -and walks her dogs in the evening   Reviewed wellness labs today-normal  Patient Active Problem List  Diagnosis  . HYPERCHOLESTEROLEMIA, MILD  . DEPRESSION  . GERD  . Routine general medical examination at a health care facility  . Routine gynecological examination   Past Medical History  Diagnosis Date  . Depressive disorder, not elsewhere classified   . Esophageal reflux   . Pure hypercholesterolemia   . Anemia   . HPV-genital warts    Past Surgical History  Procedure Date  . Tonsillectomy 1985  . Colonoscopy 11/19/10    normal  . Flexible sigmoidoscopy 11/19/10    normal  . Chin implant   . Rhinoplasty    History  Substance Use Topics  . Smoking status:  Never Smoker   . Smokeless tobacco: Not on file  . Alcohol Use: Yes     Comment: occasional   Family History  Problem Relation Age of Onset  . Alcohol abuse Father   . Arthritis      parent  . Breast cancer Mother     < 50  . Colon cancer Maternal Grandfather     < 60  . Diabetes Father   . Hyperlipidemia Father   . Hypertension Father   . Depression Mother   . Arthritis Mother     Degenerative   Allergies  Allergen Reactions  . Codeine     REACTION: nausea   Current Outpatient Prescriptions on File Prior to Visit  Medication Sig Dispense Refill  . Multiple Vitamin (MULTIVITAMIN) tablet Take 1 tablet by mouth daily.        . sertraline (ZOLOFT) 50 MG tablet TAKE ONE TABLET BY MOUTH ONE TIME DAILY  30 tablet  0     Review of Systems Review of Systems  Constitutional: Negative for fever, appetite change, fatigue and unexpected weight change.  Eyes: Negative for pain and visual disturbance.  Respiratory: Negative for cough and shortness of breath.   Cardiovascular: Negative for  cp or palpitations    Gastrointestinal: Negative for nausea, diarrhea and constipation.  Genitourinary: Negative for urgency and frequency.  Skin: Negative for pallor or rash   Neurological: Negative for weakness, light-headedness, numbness and headaches.  Hematological: Negative for adenopathy. Does not bruise/bleed easily.  Psychiatric/Behavioral: Negative for dysphoric mood. The patient is not nervous/anxious.         Objective:   Physical Exam  Constitutional: She appears well-developed and well-nourished. No distress.  HENT:  Head: Normocephalic and atraumatic.  Right Ear: External ear normal.  Left Ear: External ear normal.  Nose: Nose normal.  Mouth/Throat: Oropharynx is clear and moist.  Eyes: Conjunctivae normal and EOM are normal. Pupils are equal, round, and reactive to light. Right eye exhibits no discharge. Left eye exhibits no discharge. No scleral icterus.  Neck: Normal  range of motion. Neck supple. No JVD present. Carotid bruit is not present. No thyromegaly present.  Cardiovascular: Normal rate, regular rhythm, normal heart sounds and intact distal pulses.  Exam reveals no gallop.   Pulmonary/Chest: Effort normal and breath sounds normal. No respiratory distress. She has no wheezes.  Abdominal: Soft. Bowel sounds are normal. She exhibits no distension, no abdominal bruit and no mass. There is no tenderness.  Genitourinary: Vagina normal and uterus normal. No breast swelling, tenderness, discharge or bleeding. There is no rash or tenderness on the right labia. There is no rash or tenderness on the left labia. Uterus is not enlarged and not tender. Cervix exhibits no motion tenderness, no discharge and no friability. Right adnexum displays no mass, no tenderness and no fullness. Left adnexum displays no mass, no tenderness and no fullness. No bleeding around the vagina. No vaginal discharge found.       Some genital warts and skin tags noted on inner thighs  Breast exam: No mass, nodules, thickening, tenderness, bulging, retraction, inflamation, nipple discharge or skin changes noted.  No axillary or clavicular LA.  Chaperoned exam.    Musculoskeletal: She exhibits no edema and no tenderness.  Lymphadenopathy:    She has no cervical adenopathy.  Neurological: She is alert. She has normal reflexes. No cranial nerve deficit. She exhibits normal muscle tone. Coordination normal.  Skin: Skin is warm and dry. No rash noted. No erythema. No pallor.  Psychiatric: She has a normal mood and affect.          Assessment & Plan:

## 2012-04-21 NOTE — Patient Instructions (Addendum)
Flu vaccine today  Start using an acne cleanser with salicylic acid- like one from [employer]rogena - twice daily Do not scrub skin too hard  Do not moisturize unless you really need to  Proactive products often have good results- the cream-not the cleanser  Avoid red meat/ fried foods/ egg yolks/ fatty breakfast meats/ butter, cheese and high fat dairy/ and shellfish   Pap smear today  See dermatology for lesion under eye and some of your other issues also

## 2012-04-21 NOTE — Assessment & Plan Note (Signed)
This is very mild Disc goals for lipids and reasons to control them Rev labs with pt Rev low sat fat diet in detail  With diet change- could probably get LDL under 100

## 2012-04-21 NOTE — Assessment & Plan Note (Signed)
Reviewed health habits including diet and exercise and skin cancer prevention Also reviewed health mt list, fam hx and immunizations  Reviewed wellness labs today 

## 2012-04-23 ENCOUNTER — Other Ambulatory Visit: Payer: Self-pay | Admitting: Family Medicine

## 2012-04-23 NOTE — Telephone Encounter (Signed)
Ok to refill 

## 2012-04-23 NOTE — Telephone Encounter (Signed)
Please give 3 mo of refils I think she has f/u soon thanks

## 2012-04-26 ENCOUNTER — Encounter: Payer: Self-pay | Admitting: *Deleted

## 2012-05-19 ENCOUNTER — Other Ambulatory Visit: Payer: BC Managed Care – PPO

## 2012-05-25 ENCOUNTER — Encounter: Payer: BC Managed Care – PPO | Admitting: Family Medicine

## 2012-06-28 ENCOUNTER — Other Ambulatory Visit: Payer: Self-pay | Admitting: Family Medicine

## 2012-06-28 NOTE — Telephone Encounter (Signed)
done

## 2012-06-28 NOTE — Telephone Encounter (Signed)
Ok to refill 

## 2012-06-28 NOTE — Telephone Encounter (Signed)
She can have 12 months of refils, thanks

## 2013-04-18 ENCOUNTER — Other Ambulatory Visit: Payer: BC Managed Care – PPO

## 2013-04-20 ENCOUNTER — Other Ambulatory Visit (INDEPENDENT_AMBULATORY_CARE_PROVIDER_SITE_OTHER): Payer: BC Managed Care – PPO

## 2013-04-20 ENCOUNTER — Telehealth: Payer: Self-pay | Admitting: Family Medicine

## 2013-04-20 DIAGNOSIS — Z0000 Encounter for general adult medical examination without abnormal findings: Secondary | ICD-10-CM

## 2013-04-20 DIAGNOSIS — E7800 Pure hypercholesterolemia, unspecified: Secondary | ICD-10-CM

## 2013-04-20 LAB — COMPREHENSIVE METABOLIC PANEL
AST: 17 U/L (ref 0–37)
Albumin: 4.7 g/dL (ref 3.5–5.2)
Alkaline Phosphatase: 58 U/L (ref 39–117)
Calcium: 9.7 mg/dL (ref 8.4–10.5)
Chloride: 103 mEq/L (ref 96–112)
Creatinine, Ser: 0.7 mg/dL (ref 0.4–1.2)
Glucose, Bld: 85 mg/dL (ref 70–99)
Potassium: 4.5 mEq/L (ref 3.5–5.1)
Sodium: 136 mEq/L (ref 135–145)
Total Protein: 7.6 g/dL (ref 6.0–8.3)

## 2013-04-20 LAB — CBC WITH DIFFERENTIAL/PLATELET
Eosinophils Absolute: 0.1 10*3/uL (ref 0.0–0.7)
Eosinophils Relative: 1.1 % (ref 0.0–5.0)
HCT: 42.3 % (ref 36.0–46.0)
Lymphocytes Relative: 24.4 % (ref 12.0–46.0)
MCV: 89.4 fl (ref 78.0–100.0)
Monocytes Absolute: 0.4 10*3/uL (ref 0.1–1.0)
Monocytes Relative: 8.2 % (ref 3.0–12.0)
Neutro Abs: 3.5 10*3/uL (ref 1.4–7.7)
Neutrophils Relative %: 65.8 % (ref 43.0–77.0)
Platelets: 250 10*3/uL (ref 150.0–400.0)
WBC: 5.3 10*3/uL (ref 4.5–10.5)

## 2013-04-20 LAB — LIPID PANEL
LDL Cholesterol: 96 mg/dL (ref 0–99)
Total CHOL/HDL Ratio: 3

## 2013-04-20 NOTE — Telephone Encounter (Signed)
Message copied by Judy Pimple on Wed Apr 20, 2013 10:30 AM ------      Message from: Alvina Chou      Created: Wed Apr 13, 2013  3:02 PM      Regarding: Lab orders for Wednesday 11.19.14       Patient is scheduled for CPX labs, please order future labs, Thanks , Terri       ------

## 2013-04-22 ENCOUNTER — Other Ambulatory Visit (HOSPITAL_COMMUNITY)
Admission: RE | Admit: 2013-04-22 | Discharge: 2013-04-22 | Disposition: A | Discharge status: Home / Self Care | Payer: BC Managed Care – PPO | Source: Ambulatory Visit | Attending: Family Medicine | Admitting: Family Medicine

## 2013-04-22 ENCOUNTER — Encounter: Payer: Self-pay | Admitting: Family Medicine

## 2013-04-22 ENCOUNTER — Ambulatory Visit (INDEPENDENT_AMBULATORY_CARE_PROVIDER_SITE_OTHER): Payer: BC Managed Care – PPO | Admitting: Family Medicine

## 2013-04-22 VITALS — BP 128/84 | HR 92 | Temp 98.6°F | Ht 69.25 in | Wt 181.2 lb

## 2013-04-22 DIAGNOSIS — Z01419 Encounter for gynecological examination (general) (routine) without abnormal findings: Secondary | ICD-10-CM

## 2013-04-22 DIAGNOSIS — Z0000 Encounter for general adult medical examination without abnormal findings: Secondary | ICD-10-CM

## 2013-04-22 NOTE — Progress Notes (Signed)
Pre-visit discussion using our clinic review tool. No additional management support is needed unless otherwise documented below in the visit note.  

## 2013-04-22 NOTE — Patient Instructions (Addendum)
Pap today Cholesterol is improved - keep up good diet and exercise habits  Labs look good

## 2013-04-22 NOTE — Progress Notes (Signed)
Subjective:    Patient ID: Margaret [name], female    DOB: [birth year], 37 y.o.   MRN: [id number]  HPI Here for health maintenance exam and to review chronic medical problems    Is doing well  Nothing new going on   Wt is up 5 lb with bmi of 26  Flu vaccine 10/14  Pap 11/13 nl  Menses heavy but tolerable (heavy one days) , last 5 days , nothing she cannot handle Lab Results  Component Value Date   WBC 5.3 04/20/2013   HGB 14.7 04/20/2013   HCT 42.3 04/20/2013   MCV 89.4 04/20/2013   PLT 250.0 04/20/2013   husb had vasectomy - does not need OC   Td 1/08  Labs ok   Lab Results  Component Value Date   CHOL 174 04/20/2013   CHOL 191 04/12/2012   CHOL 197 04/10/2011   Lab Results  Component Value Date   HDL 58.20 04/20/2013   HDL 57.20 04/12/2012   HDL 58.30 04/10/2011   Lab Results  Component Value Date   LDLCALC 96 04/20/2013   LDLCALC 115* 04/12/2012   LDLCALC 119* 04/10/2011   Lab Results  Component Value Date   TRIG 99.0 04/20/2013   TRIG 95.0 04/12/2012   TRIG 99.0 04/10/2011   Lab Results  Component Value Date   CHOLHDL 3 04/20/2013   CHOLHDL 3 04/12/2012   CHOLHDL 3 04/10/2011   Lab Results  Component Value Date   LDLDIRECT 139.3 03/14/2009   her risk ratio is better  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 Routine gynecological examination 04/16/2011  . Routine general medical examination at a health care facility 04/08/2011  . HYPERCHOLESTEROLEMIA, MILD 12/05/2008  . DEPRESSION 12/05/2008  . GERD 12/05/2008   Past Medical History  Diagnosis Date  . Depressive disorder, not elsewhere classified   . Esophageal reflux   . Pure hypercholesterolemia   . Anemia   . HPV-genital warts    Past Surgical History  Procedure Laterality Date  . Tonsillectomy  1985  . Colonoscopy  11/19/10    normal  . Flexible sigmoidoscopy  11/19/10     normal  . Chin implant    . Rhinoplasty     History  Substance Use Topics  . Smoking status: Never Smoker   . Smokeless tobacco: Not on file  . Alcohol Use: Yes     Comment: occasional   Family History  Problem Relation Age of Onset  . Alcohol abuse Father   . Arthritis      parent  . Breast cancer Mother     < 50  . Colon cancer Maternal Grandfather     < 60  . Diabetes Father   . Hyperlipidemia Father   . Hypertension Father   . Depression Mother   . Arthritis Mother     Degenerative   Allergies  Allergen Reactions  . Codeine     REACTION: nausea   Current Outpatient Prescriptions on File Prior to Visit  Medication Sig Dispense Refill  . Multiple Vitamin (MULTIVITAMIN) tablet Take 1 tablet by mouth daily.         No current facility-administered medications on file prior to visit.      Review of Systems Review of Systems  Constitutional: Negative for fever, appetite change, fatigue and unexpected weight change.  Eyes:  Negative for pain and visual disturbance.  Respiratory: Negative for cough and shortness of breath.   Cardiovascular: Negative for cp or palpitations    Gastrointestinal: Negative for nausea, diarrhea and constipation.  Genitourinary: Negative for urgency and frequency.  Skin: Negative for pallor or rash   Neurological: Negative for weakness, light-headedness, numbness and headaches.  Hematological: Negative for adenopathy. Does not bruise/bleed easily.  Psychiatric/Behavioral: Negative for dysphoric mood. The patient is not nervous/anxious.         Objective:   Physical Exam  Constitutional: She appears well-developed and well-nourished. No distress.  HENT:  Head: Normocephalic and atraumatic.  Right Ear: External ear normal.  Left Ear: External ear normal.  Nose: Nose normal.  Mouth/Throat: Oropharynx is clear and moist.  Eyes: Conjunctivae and EOM are normal. Pupils are equal, round, and reactive to light. Right eye exhibits no  discharge. Left eye exhibits no discharge. No scleral icterus.  Neck: Normal range of motion. Neck supple. No JVD present. No thyromegaly present.  Cardiovascular: Normal rate, regular rhythm, normal heart sounds and intact distal pulses.  Exam reveals no gallop.   Pulmonary/Chest: Effort normal and breath sounds normal. No respiratory distress. She has no wheezes. She has no rales.  Abdominal: Soft. Bowel sounds are normal. She exhibits no distension and no mass. There is no tenderness.  Genitourinary: Vagina normal. No breast swelling, tenderness, discharge or bleeding. There is no rash, tenderness or lesion on the right labia. There is no rash, tenderness or lesion on the left labia. Uterus is not enlarged and not tender. Cervix exhibits no motion tenderness, no discharge and no friability. Right adnexum displays no mass, no tenderness and no fullness. Left adnexum displays no mass, no tenderness and no fullness. No bleeding around the vagina.  Breast exam: No mass, nodules, thickening, tenderness, bulging, retraction, inflamation, nipple discharge or skin changes noted.  No axillary or clavicular LA.  Chaperoned exam.    Musculoskeletal: She exhibits no edema and no tenderness.  Lymphadenopathy:    She has no cervical adenopathy.  Neurological: She is alert. She has normal reflexes. No cranial nerve deficit. She exhibits normal muscle tone. Coordination normal.  Skin: Skin is warm and dry. No rash noted. No erythema. No pallor.  Mild acne on back lentigos diffusely  Psychiatric: She has a normal mood and affect.          Assessment & Plan:

## 2013-04-24 NOTE — Assessment & Plan Note (Signed)
Exam and pap done  No problems

## 2013-04-24 NOTE — Assessment & Plan Note (Signed)
Reviewed health habits including diet and exercise and skin cancer prevention Reviewed appropriate screening tests for age  Also reviewed health mt list, fam hx and immunization status , as well as social and family history   Labs reviewed  

## 2013-04-25 ENCOUNTER — Encounter: Payer: BC Managed Care – PPO | Admitting: Family Medicine

## 2013-04-29 ENCOUNTER — Encounter: Payer: Self-pay | Admitting: *Deleted

## 2013-12-07 ENCOUNTER — Telehealth: Payer: Self-pay

## 2013-12-07 NOTE — Telephone Encounter (Signed)
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

## 2013-12-12 ENCOUNTER — Encounter: Payer: Self-pay | Admitting: Family Medicine

## 2013-12-12 ENCOUNTER — Ambulatory Visit (INDEPENDENT_AMBULATORY_CARE_PROVIDER_SITE_OTHER): Payer: BC Managed Care – PPO | Admitting: Family Medicine

## 2013-12-12 VITALS — BP 122/78 | HR 81 | Temp 98.4°F | Ht 69.25 in | Wt 169.0 lb

## 2013-12-12 DIAGNOSIS — Z021 Encounter for pre-employment examination: Secondary | ICD-10-CM

## 2013-12-12 DIAGNOSIS — Z111 Encounter for screening for respiratory tuberculosis: Secondary | ICD-10-CM

## 2013-12-12 DIAGNOSIS — Z0289 Encounter for other administrative examinations: Secondary | ICD-10-CM

## 2013-12-12 NOTE — Patient Instructions (Addendum)
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

## 2013-12-12 NOTE — Assessment & Plan Note (Signed)
No restrictions for employment imms up to date  PPD placed today and can have that read in 48 hours/ form will be picked up then

## 2013-12-12 NOTE — Progress Notes (Signed)
Subjective:    Patient ID: Margaret [name], female    DOB: 1976/02/08, 38 y.o.   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 Pre-employment examination 12/12/2013  . Routine gynecological examination 04/16/2011  . Routine general medical examination at a health care facility 04/08/2011  . HYPERCHOLESTEROLEMIA, MILD 12/05/2008  . DEPRESSION 12/05/2008  . GERD 12/05/2008   Past Medical History  Diagnosis Date  . Depressive disorder, not elsewhere classified   . Esophageal reflux   . Pure hypercholesterolemia   . Anemia   . HPV-genital warts    Past Surgical History  Procedure Laterality Date  . Tonsillectomy  1985  . Colonoscopy  11/19/10    normal  . Flexible sigmoidoscopy  11/19/10    normal  . Chin implant    . Rhinoplasty     History  Substance Use Topics  . Smoking status: Never Smoker   . Smokeless tobacco: Not on file  . Alcohol Use: Yes     Comment: occasional   Family History  Problem Relation Age of Onset  . Alcohol abuse Father   . Arthritis      parent  . Breast cancer Mother     < 50  . Colon cancer Maternal Grandfather     < 60  . Diabetes Father   . Hyperlipidemia Father   . Hypertension Father   . Depression Mother   . Arthritis Mother     Degenerative   Allergies  Allergen Reactions  . Codeine     REACTION: nausea   Current Outpatient Prescriptions on File Prior to Visit  Medication Sig Dispense Refill  . Multiple Vitamin (MULTIVITAMIN) tablet Take 1  tablet by mouth daily.         No current facility-administered medications on file prior to visit.    Review of Systems Review of Systems  Constitutional: Negative for fever, appetite change, fatigue and unexpected weight change.  Eyes: Negative for pain and visual disturbance.  Respiratory: Negative for cough and shortness of breath.   Cardiovascular: Negative for cp or palpitations    Gastrointestinal: Negative for nausea, diarrhea and constipation.  Genitourinary: Negative for urgency and frequency.  Skin: Negative for pallor or rash   Neurological: Negative for weakness, light-headedness, numbness and headaches.  Hematological: Negative for adenopathy. Does not bruise/bleed easily.  Psychiatric/Behavioral: Negative for dysphoric mood. The patient is not nervous/anxious.         Objective:   Physical Exam  Constitutional: She appears well-developed and well-nourished. No distress.  HENT:  Head: Normocephalic and atraumatic.  Right Ear: External ear normal.  Left Ear: External ear normal.  Mouth/Throat: Oropharynx is clear and moist.  Eyes: Conjunctivae and EOM are normal. Pupils are equal, round, and reactive to light. Left eye exhibits discharge. No scleral icterus.  Neck: Normal range of motion. Neck supple. No JVD present. Carotid bruit is  not present. No thyromegaly present.  Cardiovascular: Normal rate, regular rhythm, normal heart sounds and intact distal pulses.   Pulmonary/Chest: Effort normal and breath sounds normal.  Abdominal: Soft. Bowel sounds are normal.  Musculoskeletal: She exhibits no edema.  No acute joint changes   Lymphadenopathy:    She has no cervical adenopathy.  Neurological: She is alert. She has normal reflexes.  Skin: Skin is warm and dry. No rash noted. No erythema. No pallor.  Psychiatric: She has a normal mood and affect.          Assessment & Plan:   Problem List Items Addressed This Visit     Other   Pre-employment examination -  Primary     No restrictions for employment imms up to date  PPD placed today and can have that read in 48 hours/ form will be picked up then    Relevant Orders      TB Skin Test (Completed)    Other Visit Diagnoses   Visit for TB skin test        Relevant Orders       TB Skin Test (Completed)

## 2013-12-12 NOTE — Progress Notes (Signed)
Pre visit review using our clinic review tool, if applicable. No additional management support is needed unless otherwise documented below in the visit note. 

## 2013-12-14 LAB — TB SKIN TEST: TB Skin Test: NEGATIVE

## 2014-03-10 ENCOUNTER — Encounter: Payer: Self-pay | Admitting: Family Medicine

## 2014-03-10 ENCOUNTER — Ambulatory Visit (INDEPENDENT_AMBULATORY_CARE_PROVIDER_SITE_OTHER): Payer: BC Managed Care – PPO | Admitting: Family Medicine

## 2014-03-10 VITALS — BP 116/78 | HR 94 | Temp 99.0°F | Ht 69.25 in | Wt 168.0 lb

## 2014-03-10 DIAGNOSIS — J069 Acute upper respiratory infection, unspecified: Secondary | ICD-10-CM

## 2014-03-10 DIAGNOSIS — B9789 Other viral agents as the cause of diseases classified elsewhere: Principal | ICD-10-CM

## 2014-03-10 MED ORDER — AMOXICILLIN-POT CLAVULANATE 875-125 MG PO TABS: 1.0000 | ORAL_TABLET | Freq: Two times a day (BID) | ORAL | Status: DC

## 2014-03-10 MED ORDER — BENZONATATE 200 MG PO CAPS
200.0000 mg | ORAL_CAPSULE | Freq: Three times a day (TID) | ORAL | Status: DC | PRN
Start: 2014-03-10 — End: 2014-05-22

## 2014-03-10 NOTE — Patient Instructions (Signed)
I think you have a viral upper respiratory infection / could be an early sinus infection  Drink fluids and rest  Take tessalon for cough as needed  Continue mucinex 1200 mg twice daily  For nasal congestion- breathe steam / try simply saline nasal spray  If you begin headache/facial pain or increased temp over 100 - go ahead and fill px for augmentin   Update if not starting to improve in a week or if worsening

## 2014-03-10 NOTE — Assessment & Plan Note (Signed)
Reassuring exam - but worried about development into a bacterial sinus inf  Given tessalon 200 mg tid prn cough Will continue mucinex Steam and nasal saline spray for congestion  Given printed px for augmentin to fill if temp increases or if she develops facial pain or does not improve further over the next week

## 2014-03-10 NOTE — Progress Notes (Signed)
Subjective:    Patient ID: Margaret HasteElizabeth C Gosnell, female    DOB: 01/06/1976, 38 y.o.   MRN: [id number]  HPI Here with uri symptoms  Has been sick for over a week   Took mucinex (was DM and now plain 1200 mg bid)  and some otc allergy medicine (advil cold and sinus)  Very congested , ears are full  Getting imbalanced - as of yesterday  A bit of pressure in her face at night   Coughing up mucous - green and yellow   Temp is 99 today - ? At home Has had chills and sweats   Patient Active Problem List   Diagnosis Date Noted  . Pre-employment examination 12/12/2013  . Routine gynecological examination 04/16/2011  . Routine general medical examination at a health care facility 04/08/2011  . HYPERCHOLESTEROLEMIA, MILD 12/05/2008  . DEPRESSION 12/05/2008  . GERD 12/05/2008   Past Medical History  Diagnosis Date  . Depressive disorder, not elsewhere classified   . Esophageal reflux   . Pure hypercholesterolemia   . Anemia   . HPV-genital warts    Past Surgical History  Procedure Laterality Date  . Tonsillectomy  1985  . Colonoscopy  11/19/10    normal  . Flexible sigmoidoscopy  11/19/10    normal  . Chin implant    . Rhinoplasty     History  Substance Use Topics  . Smoking status: Never Smoker   . Smokeless tobacco: Not on file  . Alcohol Use: Yes     Comment: occasional   Family History  Problem Relation Age of Onset  . Alcohol abuse Father   . Arthritis      parent  . Breast cancer Mother     < 50  . Colon cancer Maternal Grandfather     < 60  . Diabetes Father   . Hyperlipidemia Father   . Hypertension Father   . Depression Mother   . Arthritis Mother     Degenerative   Allergies  Allergen Reactions  . Codeine     REACTION: nausea   Current Outpatient Prescriptions on File Prior to Visit  Medication Sig Dispense Refill  . Multiple Vitamin (MULTIVITAMIN) tablet Take 1 tablet by mouth daily.         No current facility-administered medications on file  prior to visit.     Review of Systems Review of Systems  Constitutional: Negative for , appetite change,  and unexpected weight change. pos for malaise  ENT pos for congestion / rhinorrhea/sinus pressure/ neg for ear pain  Eyes: Negative for pain and visual disturbance.  Respiratory: Negative for wheeze and shortness of breath.   Cardiovascular: Negative for cp or palpitations    Gastrointestinal: Negative for nausea, diarrhea and constipation.  Genitourinary: Negative for urgency and frequency.  Skin: Negative for pallor or rash   Neurological: Negative for weakness, numbness and headaches. pos for positional dizziness  Hematological: Negative for adenopathy. Does not bruise/bleed easily.  Psychiatric/Behavioral: Negative for dysphoric mood. The patient is not nervous/anxious.         Objective:   Physical Exam  Constitutional: She appears well-developed and well-nourished. No distress.  HENT:  Head: Normocephalic and atraumatic.  Right Ear: External ear normal.  Left Ear: External ear normal.  Mouth/Throat: Oropharynx is clear and moist. No oropharyngeal exudate.  Nares are injected and congested  Clear rhinorrhea TMs dull bilat   No sinus tenderness   Eyes: Conjunctivae and EOM are normal. Pupils are equal, round,  and reactive to light. Right eye exhibits no discharge. Left eye exhibits no discharge. No scleral icterus.  No nystagmus   Neck: Normal range of motion. Neck supple.  Cardiovascular: Normal rate, regular rhythm and intact distal pulses.   Pulmonary/Chest: Effort normal and breath sounds normal. No respiratory distress. She has no wheezes. She has no rales.  Lymphadenopathy:    She has no cervical adenopathy.  Neurological: She is alert. She has normal reflexes.  Skin: Skin is warm and dry. No rash noted. No erythema. No pallor.  Psychiatric: She has a normal mood and affect.          Assessment & Plan:   Problem List Items Addressed This Visit      Respiratory   Viral URI with cough - Primary     Reassuring exam - but worried about development into a bacterial sinus inf  Given tessalon 200 mg tid prn cough Will continue mucinex Steam and nasal saline spray for congestion  Given printed px for augmentin to fill if temp increases or if she develops facial pain or does not improve further over the next week

## 2014-03-10 NOTE — Progress Notes (Signed)
Pre visit review using our clinic review tool, if applicable. No additional management support is needed unless otherwise documented below in the visit note. 

## 2014-05-22 ENCOUNTER — Ambulatory Visit (INDEPENDENT_AMBULATORY_CARE_PROVIDER_SITE_OTHER): Payer: BC Managed Care – PPO | Admitting: Family Medicine

## 2014-05-22 ENCOUNTER — Encounter: Payer: Self-pay | Admitting: Family Medicine

## 2014-05-22 ENCOUNTER — Other Ambulatory Visit (HOSPITAL_COMMUNITY)
Admission: RE | Admit: 2014-05-22 | Discharge: 2014-05-22 | Disposition: A | Discharge status: Home / Self Care | Payer: BC Managed Care – PPO | Source: Ambulatory Visit | Attending: Family Medicine | Admitting: Family Medicine

## 2014-05-22 VITALS — BP 114/70 | HR 81 | Temp 98.9°F | Ht 69.0 in | Wt 165.0 lb

## 2014-05-22 DIAGNOSIS — Z01419 Encounter for gynecological examination (general) (routine) without abnormal findings: Secondary | ICD-10-CM | POA: Diagnosis present

## 2014-05-22 DIAGNOSIS — Z0000 Encounter for general adult medical examination without abnormal findings: Secondary | ICD-10-CM

## 2014-05-22 LAB — HM PAP SMEAR: HM PAP: NORMAL

## 2014-05-22 NOTE — Assessment & Plan Note (Signed)
Annual exam with pap No need for contraception No complaints

## 2014-05-22 NOTE — Progress Notes (Signed)
Pre visit review using our clinic review tool, if applicable. No additional management support is needed unless otherwise documented below in the visit note. 

## 2014-05-22 NOTE — Progress Notes (Signed)
Subjective:    Patient ID: Margaret [name], female    DOB: 1975/10/12, 38 y.o.   MRN: [id number]  HPI Here for health maintenance exam and to review chronic medical problems    Feeling well overall Nothing new going on    Wt is down 3 lb with bmi of 24 Started teaching kindergarten- busy schedule  Is always moving    Flu vaccine 10/15 Td 1/08  Pap 11/14 normal  Menses -no problems , not too heavy or painful  Not on contraception (her partner had a vasectomy)   Nl labs nov 2014  Lab Results  Component Value Date   CHOL 174 04/20/2013   HDL 58.20 04/20/2013   LDLCALC 96 04/20/2013   LDLDIRECT 139.3 03/14/2009   TRIG 99.0 04/20/2013   CHOLHDL 3 04/20/2013   very good   Will hold off on lab today - had good ones a year ago  Patient Active Problem List   Diagnosis Date Noted  . Pre-employment examination 12/12/2013  . Encounter for routine gynecological examination 04/16/2011  . Routine general medical examination at a health care facility 04/08/2011  . DEPRESSION 12/05/2008  . GERD 12/05/2008   Past Medical History  Diagnosis Date  . Depressive disorder, not elsewhere classified   . Esophageal reflux   . Pure hypercholesterolemia   . Anemia   . HPV-genital warts    Past Surgical History  Procedure Laterality Date  . Tonsillectomy  1985  . Colonoscopy  11/19/10    normal  . Flexible sigmoidoscopy  11/19/10    normal  . Chin implant    . Rhinoplasty     History  Substance Use Topics  . Smoking status: Never Smoker   . Smokeless tobacco: Not on file  . Alcohol Use: 0.0 oz/week    0 Not specified per week     Comment: occasional   Family History  Problem Relation Age of Onset  . Alcohol abuse Father   . Arthritis      parent  . Breast cancer Mother     < 50  . Colon cancer Maternal Grandfather     < 60  . Diabetes Father   . Hyperlipidemia Father   . Hypertension Father   . Depression Mother   . Arthritis Mother     Degenerative    Allergies  Allergen Reactions  . Codeine     REACTION: nausea   Current Outpatient Prescriptions on File Prior to Visit  Medication Sig Dispense Refill  . Multiple Vitamin (MULTIVITAMIN) tablet Take 1 tablet by mouth daily.       No current facility-administered medications on file prior to visit.     Review of Systems Review of Systems  Constitutional: Negative for fever, appetite change, fatigue and unexpected weight change.  Eyes: Negative for pain and visual disturbance.  Respiratory: Negative for cough and shortness of breath.   Cardiovascular: Negative for cp or palpitations    Gastrointestinal: Negative for nausea, diarrhea and constipation.  Genitourinary: Negative for urgency and frequency.  Skin: Negative for pallor or rash   Neurological: Negative for weakness, light-headedness, numbness and headaches.  Hematological: Negative for adenopathy. Does not bruise/bleed easily.  Psychiatric/Behavioral: Negative for dysphoric mood. The patient is not nervous/anxious.         Objective:   Physical Exam  Constitutional: She appears well-developed and well-nourished. No distress.  HENT:  Head: Normocephalic and atraumatic.  Right Ear: External ear normal.  Left Ear: External ear normal.  Nose: Nose normal.  Mouth/Throat: Oropharynx is clear and moist.  Eyes: Conjunctivae and EOM are normal. Pupils are equal, round, and reactive to light. Right eye exhibits no discharge. Left eye exhibits no discharge. No scleral icterus.  Neck: Normal range of motion. Neck supple. No JVD present. No thyromegaly present.  Cardiovascular: Normal rate, regular rhythm, normal heart sounds and intact distal pulses.  Exam reveals no gallop.   Pulmonary/Chest: Effort normal and breath sounds normal. No respiratory distress. She has no wheezes. She has no rales.  Abdominal: Soft. Bowel sounds are normal. She exhibits no distension and no mass. There is no tenderness.  Genitourinary: Vagina  normal. No breast swelling, tenderness, discharge or bleeding. There is no rash, tenderness or lesion on the right labia. There is no rash, tenderness or lesion on the left labia. Uterus is not enlarged and not tender. Cervix exhibits no motion tenderness, no discharge and no friability. Right adnexum displays no mass, no tenderness and no fullness. Left adnexum displays no mass, no tenderness and no fullness. No erythema or bleeding in the vagina. No vaginal discharge found.  Musculoskeletal: She exhibits no edema or tenderness.  Lymphadenopathy:    She has no cervical adenopathy.  Neurological: She is alert. She has normal reflexes. No cranial nerve deficit. She exhibits normal muscle tone. Coordination normal.  Skin: Skin is warm and dry. No rash noted. No erythema. No pallor.  Psychiatric: She has a normal mood and affect.          Assessment & Plan:   Problem List Items Addressed This Visit      Other   Encounter for routine gynecological examination    Annual exam with pap No need for contraception No complaints     Relevant Orders      Cytology - PAP   RESOLVED: HYPERCHOLESTEROLEMIA, MILD   Routine general medical examination at a health care facility - Primary    Reviewed health habits including diet and exercise and skin cancer prevention Reviewed appropriate screening tests for age  Also reviewed health mt list, fam hx and immunization status , as well as social and family history   Doing well  Rev labs from a year ago incl cholesterol (good profile)

## 2014-05-22 NOTE — Assessment & Plan Note (Signed)
Reviewed health habits including diet and exercise and skin cancer prevention Reviewed appropriate screening tests for age  Also reviewed health mt list, fam hx and immunization status , as well as social and family history   Doing well  Rev labs from a year ago incl cholesterol (good profile)

## 2014-05-22 NOTE — Patient Instructions (Signed)
If you are interested - call your insurance co and see if a "baseline mammogram" is covered- if so call and let me know and I will refer you for one  Take care of yourself  Eat a healthy diet and stay fit

## 2014-05-23 ENCOUNTER — Encounter: Payer: Self-pay | Admitting: *Deleted

## 2014-05-23 LAB — CYTOLOGY - PAP

## 2015-05-20 ENCOUNTER — Telehealth: Payer: Self-pay | Admitting: Family Medicine

## 2015-05-20 DIAGNOSIS — Z0000 Encounter for general adult medical examination without abnormal findings: Secondary | ICD-10-CM

## 2015-05-20 NOTE — Telephone Encounter (Signed)
-----   Message from Alvina Chouerri J Walsh sent at 05/17/2015  9:11 AM EST ----- Regarding: Lab orders for Monday,12.19.16 Patient is scheduled for CPX labs, please order future labs, Thanks , Camelia Engerri

## 2015-05-21 ENCOUNTER — Other Ambulatory Visit (INDEPENDENT_AMBULATORY_CARE_PROVIDER_SITE_OTHER): Payer: BC Managed Care – PPO

## 2015-05-21 DIAGNOSIS — Z0000 Encounter for general adult medical examination without abnormal findings: Secondary | ICD-10-CM

## 2015-05-21 LAB — BASIC METABOLIC PANEL
BUN: 12 mg/dL (ref 6–23)
CO2: 27 meq/L (ref 19–32)
Calcium: 9.6 mg/dL (ref 8.4–10.5)
Chloride: 104 mEq/L (ref 96–112)
Creatinine, Ser: 0.84 mg/dL (ref 0.40–1.20)
GFR: 80.69 mL/min (ref >60.00)
Glucose, Bld: 90 mg/dL (ref 70–99)
Potassium: 4 mEq/L (ref 3.5–5.1)
Sodium: 138 mEq/L (ref 135–145)

## 2015-05-21 LAB — COMPREHENSIVE METABOLIC PANEL
ALT: 10 U/L (ref 0–35)
AST: 15 U/L (ref 0–37)
Albumin: 4.7 g/dL (ref 3.5–5.2)
Alkaline Phosphatase: 58 U/L (ref 39–117)
BUN: 12 mg/dL (ref 6–23)
CO2: 27 mEq/L (ref 19–32)
Calcium: 9.6 mg/dL (ref 8.4–10.5)
Chloride: 104 mEq/L (ref 96–112)
Creatinine, Ser: 0.84 mg/dL (ref 0.40–1.20)
GFR: 80.69 mL/min (ref >60.00)
GLUCOSE: 90 mg/dL (ref 70–99)
Potassium: 4 mEq/L (ref 3.5–5.1)
SODIUM: 138 meq/L (ref 135–145)
Total Bilirubin: 0.6 mg/dL (ref 0.2–1.2)
Total Protein: 7.3 g/dL (ref 6.0–8.3)

## 2015-05-21 LAB — LIPID PANEL
CHOL/HDL RATIO: 3
CHOLESTEROL: 171 mg/dL (ref 0–200)
HDL: 64.6 mg/dL (ref >39.00)
LDL CALC: 87 mg/dL (ref 0–99)
NonHDL: 106.69
TRIGLYCERIDES: 98 mg/dL (ref 0.0–149.0)
VLDL: 19.6 mg/dL (ref 0.0–40.0)

## 2015-05-21 LAB — CBC WITH DIFFERENTIAL/PLATELET
Basophils Absolute: 0 10*3/uL (ref 0.0–0.1)
Basophils Relative: 0.5 % (ref 0.0–3.0)
EOS ABS: 0.1 10*3/uL (ref 0.0–0.7)
Eosinophils Relative: 1.9 % (ref 0.0–5.0)
HCT: 44.7 % (ref 36.0–46.0)
Hemoglobin: 15.2 g/dL — ABNORMAL HIGH (ref 12.0–15.0)
LYMPHS ABS: 1.8 10*3/uL (ref 0.7–4.0)
Lymphocytes Relative: 32 % (ref 12.0–46.0)
MCHC: 34.1 g/dL (ref 30.0–36.0)
MCV: 91.4 fl (ref 78.0–100.0)
Monocytes Absolute: 0.4 10*3/uL (ref 0.1–1.0)
Monocytes Relative: 7.8 % (ref 3.0–12.0)
NEUTROS PCT: 57.8 % (ref 43.0–77.0)
Neutro Abs: 3.2 10*3/uL (ref 1.4–7.7)
Platelets: 253 10*3/uL (ref 150.0–400.0)
RBC: 4.89 Mil/uL (ref 3.87–5.11)
RDW: 12.8 % (ref 11.5–15.5)
WBC: 5.6 10*3/uL (ref 4.0–10.5)

## 2015-05-21 LAB — TSH: TSH: 0.93 u[IU]/mL (ref 0.35–4.50)

## 2015-05-25 ENCOUNTER — Encounter: Payer: BC Managed Care – PPO | Admitting: Family Medicine

## 2015-05-30 ENCOUNTER — Other Ambulatory Visit (HOSPITAL_COMMUNITY)
Admission: RE | Admit: 2015-05-30 | Discharge: 2015-05-30 | Disposition: A | Discharge status: Home / Self Care | Payer: BC Managed Care – PPO | Source: Ambulatory Visit | Attending: Family Medicine | Admitting: Family Medicine

## 2015-05-30 ENCOUNTER — Ambulatory Visit (INDEPENDENT_AMBULATORY_CARE_PROVIDER_SITE_OTHER): Payer: BC Managed Care – PPO | Admitting: Family Medicine

## 2015-05-30 ENCOUNTER — Encounter: Payer: Self-pay | Admitting: Family Medicine

## 2015-05-30 VITALS — BP 114/78 | HR 86 | Temp 98.6°F | Ht 69.0 in | Wt 174.5 lb

## 2015-05-30 DIAGNOSIS — Z0000 Encounter for general adult medical examination without abnormal findings: Secondary | ICD-10-CM

## 2015-05-30 DIAGNOSIS — Z01419 Encounter for gynecological examination (general) (routine) without abnormal findings: Secondary | ICD-10-CM | POA: Diagnosis not present

## 2015-05-30 DIAGNOSIS — Z803 Family history of malignant neoplasm of breast: Secondary | ICD-10-CM | POA: Diagnosis not present

## 2015-05-30 DIAGNOSIS — Z1151 Encounter for screening for human papillomavirus (HPV): Secondary | ICD-10-CM | POA: Diagnosis not present

## 2015-05-30 NOTE — Progress Notes (Signed)
Pre visit review using our clinic review tool, if applicable. No additional management support is needed unless otherwise documented below in the visit note. 

## 2015-05-30 NOTE — Progress Notes (Signed)
Subjective:    Patient ID: Margaret [name], female    DOB: 1975/12/25, 39 y.o.   MRN: [id number]  HPI Here for health maintenance exam and to review chronic medical problems    Doing well  Enjoying a break  Does not sleep well - takes advil pm (some knee pain too)   Wt is up 9 lb with bmi of 25 Thinks it was the hospital    HIV screening - not high risk / declines   Flu shot 11/16 Td 1/08  Pap 12/15 nl  Menses - fine / regular /heavy the first 2 days-tolerable/ not a lot of cramps  husb was fixed- no need for contraception  Declines STD screening with pap   Self breast exam - no lumps  Sister dx with breast cancer last year- was treated  Pt is BRACA neg    Mood = has been fine   Results for orders placed or performed in visit on 05/21/15  CBC with Differential/Platelet  Result Value Ref Range   WBC 5.6 4.0 - 10.5 K/uL   RBC 4.89 3.87 - 5.11 Mil/uL   Hemoglobin 15.2 (H) 12.0 - 15.0 g/dL   HCT 91.4 78.2 - 95.6 %   MCV 91.4 78.0 - 100.0 fl   MCHC 34.1 30.0 - 36.0 g/dL   RDW 21.3 08.6 - 57.8 %   Platelets 253.0 150.0 - 400.0 K/uL   Neutrophils Relative % 57.8 43.0 - 77.0 %   Lymphocytes Relative 32.0 12.0 - 46.0 %   Monocytes Relative 7.8 3.0 - 12.0 %   Eosinophils Relative 1.9 0.0 - 5.0 %   Basophils Relative 0.5 0.0 - 3.0 %   Neutro Abs 3.2 1.4 - 7.7 K/uL   Lymphs Abs 1.8 0.7 - 4.0 K/uL   Monocytes Absolute 0.4 0.1 - 1.0 K/uL   Eosinophils Absolute 0.1 0.0 - 0.7 K/uL   Basophils Absolute 0.0 0.0 - 0.1 K/uL  Comprehensive metabolic panel  Result Value Ref Range   Sodium 138 135 - 145 mEq/L   Potassium 4.0 3.5 - 5.1 mEq/L   Chloride 104 96 - 112 mEq/L   CO2 27 19 - 32 mEq/L   Glucose, Bld 90 70 - 99 mg/dL   BUN 12 6 - 23 mg/dL   Creatinine, Ser 4.69 0.40 - 1.20 mg/dL   Total Bilirubin 0.6 0.2 - 1.2 mg/dL   Alkaline Phosphatase 58 39 - 117 U/L   AST 15 0 - 37 U/L   ALT 10 0 - 35 U/L   Total Protein 7.3 6.0 - 8.3 g/dL   Albumin 4.7 3.5 - 5.2 g/dL   Calcium 9.6 8.4 - 62.9 mg/dL   GFR 52.84 >13.24 mL/min  Basic metabolic panel  Result Value Ref Range   Sodium 138 135 - 145 mEq/L   Potassium 4.0 3.5 - 5.1 mEq/L   Chloride 104 96 - 112 mEq/L   CO2 27 19 - 32 mEq/L   Glucose, Bld 90 70 - 99 mg/dL   BUN 12 6 - 23 mg/dL   Creatinine, Ser 4.01 0.40 - 1.20 mg/dL   Calcium 9.6 8.4 - 02.7 mg/dL   GFR 25.36 >64.40 mL/min  Lipid panel  Result Value Ref Range   Cholesterol 171 0 - 200 mg/dL   Triglycerides 34.7 0.0 - 149.0 mg/dL   HDL 42.59 >56.38 mg/dL   VLDL 75.6 0.0 - 43.3 mg/dL   LDL Cholesterol 87 0 - 99 mg/dL   Total CHOL/HDL Ratio 3  NonHDL 106.69   TSH  Result Value Ref Range   TSH 0.93 0.35 - 4.50 uIU/mL      Review of Systems    Review of Systems  Constitutional: Negative for fever, appetite change, fatigue and unexpected weight change.  Eyes: Negative for pain and visual disturbance.  Respiratory: Negative for cough and shortness of breath.   Cardiovascular: Negative for cp or palpitations    Gastrointestinal: Negative for nausea, diarrhea and constipation.  Genitourinary: Negative for urgency and frequency.  Skin: Negative for pallor or rash   Neurological: Negative for weakness, light-headedness, numbness and headaches.  Hematological: Negative for adenopathy. Does not bruise/bleed easily.  Psychiatric/Behavioral: Negative for dysphoric mood. The patient is not nervous/anxious.  pos for sleeping problems     Objective:   Physical Exam  Constitutional: She appears well-developed and well-nourished. No distress.  Well appearing   HENT:  Head: Normocephalic and atraumatic.  Right Ear: External ear normal.  Left Ear: External ear normal.  Mouth/Throat: Oropharynx is clear and moist.  Eyes: Conjunctivae and EOM are normal. Pupils are equal, round, and reactive to light. No scleral icterus.  Neck: Normal range of motion. Neck supple. No JVD present. Carotid bruit is not present. No thyromegaly present.    Cardiovascular: Normal rate, regular rhythm, normal heart sounds and intact distal pulses.  Exam reveals no gallop.   Pulmonary/Chest: Effort normal and breath sounds normal. No respiratory distress. She has no wheezes. She exhibits no tenderness.  Abdominal: Soft. Bowel sounds are normal. She exhibits no distension, no abdominal bruit and no mass. There is no tenderness.  Genitourinary: Vagina normal and uterus normal. No breast swelling, tenderness, discharge or bleeding. There is no rash, tenderness or lesion on the right labia. There is no rash, tenderness or lesion on the left labia. Uterus is not enlarged and not tender. Cervix exhibits no motion tenderness, no discharge and no friability. Right adnexum displays no mass, no tenderness and no fullness. Left adnexum displays no mass, no tenderness and no fullness. No erythema or bleeding in the vagina. No vaginal discharge found.  Scant blood at cervical os  Breast exam: No mass, nodules, thickening, tenderness, bulging, retraction, inflamation, nipple discharge or skin changes noted.  No axillary or clavicular LA.      Musculoskeletal: Normal range of motion. She exhibits no edema or tenderness.  Lymphadenopathy:    She has no cervical adenopathy.  Neurological: She is alert. She has normal reflexes. No cranial nerve deficit. She exhibits normal muscle tone. Coordination normal.  Skin: Skin is warm and dry. No rash noted. No erythema. No pallor.  Psychiatric: She has a normal mood and affect.          Assessment & Plan:   Problem List Items Addressed This Visit      Other   Encounter for routine gynecological examination    Routine annual exam  No complaints  Pap result pending       Relevant Orders   Cytology - PAP   Family history of breast cancer    Pt will check with ins and see if baseline mammogram would be covered  Enc regular self exams       Routine general medical examination at a health care facility - Primary     Reviewed health habits including diet and exercise and skin cancer prevention Reviewed appropriate screening tests for age  Also reviewed health mt list, fam hx and immunization status , as well as social and family history  See HPI Labs reviewed Call your insurance to see if a baseline mammogram would be covered  Tell them about your family history  If it is- call and I will refer you for one  Try benadryl 25-50 mg at night for sleep if needed  Take care of yourself Labs look good

## 2015-05-30 NOTE — Patient Instructions (Signed)
Call your insurance to see if a baseline mammogram would be covered  Tell them about your family history  If it is- call and I will refer you for one  Try benadryl 25-50 mg at night for sleep if needed  Take care of yourself Labs look good

## 2015-05-31 LAB — CYTOLOGY - PAP

## 2015-05-31 NOTE — Assessment & Plan Note (Signed)
Routine annual exam  No complaints  Pap result pending

## 2015-05-31 NOTE — Assessment & Plan Note (Signed)
Pt will check with ins and see if baseline mammogram would be covered  Enc regular self exams

## 2015-05-31 NOTE — Assessment & Plan Note (Signed)
Reviewed health habits including diet and exercise and skin cancer prevention Reviewed appropriate screening tests for age  Also reviewed health mt list, fam hx and immunization status , as well as social and family history   See HPI Labs reviewed Call your insurance to see if a baseline mammogram would be covered  Tell them about your family history  If it is- call and I will refer you for one  Try benadryl 25-50 mg at night for sleep if needed  Take care of yourself Labs look good

## 2015-06-06 ENCOUNTER — Encounter: Payer: Self-pay | Admitting: *Deleted

## 2015-07-23 ENCOUNTER — Ambulatory Visit (INDEPENDENT_AMBULATORY_CARE_PROVIDER_SITE_OTHER): Payer: BC Managed Care – PPO | Admitting: Family Medicine

## 2015-07-23 ENCOUNTER — Encounter: Payer: Self-pay | Admitting: Family Medicine

## 2015-07-23 VITALS — BP 122/84 | HR 84 | Temp 98.7°F | Wt 177.5 lb

## 2015-07-23 DIAGNOSIS — J029 Acute pharyngitis, unspecified: Secondary | ICD-10-CM

## 2015-07-23 LAB — POCT RAPID STREP A (OFFICE): Rapid Strep A Screen: NEGATIVE

## 2015-07-23 MED ORDER — AMOXICILLIN 875 MG PO TABS: 875.0000 mg | ORAL_TABLET | Freq: Two times a day (BID) | ORAL | Status: AC

## 2015-07-23 NOTE — Assessment & Plan Note (Signed)
ST, LA, no cough.  Several cases of strep exposure.  D/w pt.  Likely false neg RST, ie likely strep throat, would treat.  Start amoxil.  Supportive care.  Nontoxic.

## 2015-07-23 NOTE — Progress Notes (Signed)
Pre visit review using our clinic review tool, if applicable. No additional management support is needed unless otherwise documented below in the visit note.  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  One known flu case.  Sx started last week with HA and congestion.  Then had ST, pain with swallowing.  No known fevers.  No cough.  Taking mucinex and advil OTC cold meds.  No vomiting, no diarrhea.  Some nausea.  No rash.    Meds, vitals, and allergies reviewed.   ROS: See HPI.  Otherwise, noncontributory.  nad ncat Tm wnl B Nasal exam stuffy OP with cobblestoning Neck with L sided LA noted, tender, not on R No stridor rrr ctab

## 2015-07-23 NOTE — Patient Instructions (Signed)
Drink plenty of fluids, take tylenol as needed, and gargle with warm salt water for your throat.  This should gradually improve.  Take care.  Let us know if you have other concerns.   Start amoxil today.

## 2016-05-18 ENCOUNTER — Telehealth: Payer: Self-pay | Admitting: Family Medicine

## 2016-05-18 DIAGNOSIS — Z0000 Encounter for general adult medical examination without abnormal findings: Secondary | ICD-10-CM

## 2016-05-18 NOTE — Telephone Encounter (Signed)
-----   Message from Baldomero LamyNatasha C Chavers sent at 05/14/2016 11:46 AM EST ----- Regarding: Cpx labs Wed 12/20, need orders. Thanks! :-) Please order  future cpx labs for pt's upcoming lab appt. Thanks Rodney Boozeasha

## 2016-05-21 ENCOUNTER — Other Ambulatory Visit (INDEPENDENT_AMBULATORY_CARE_PROVIDER_SITE_OTHER): Payer: BC Managed Care – PPO

## 2016-05-21 DIAGNOSIS — Z0000 Encounter for general adult medical examination without abnormal findings: Secondary | ICD-10-CM

## 2016-05-21 LAB — CBC WITH DIFFERENTIAL/PLATELET
BASOS PCT: 0.6 % (ref 0.0–3.0)
Basophils Absolute: 0 10*3/uL (ref 0.0–0.1)
EOS PCT: 2 % (ref 0.0–5.0)
Eosinophils Absolute: 0.1 10*3/uL (ref 0.0–0.7)
HEMATOCRIT: 42.5 % (ref 36.0–46.0)
HEMOGLOBIN: 14.8 g/dL (ref 12.0–15.0)
Lymphocytes Relative: 30.4 % (ref 12.0–46.0)
Lymphs Abs: 1.5 10*3/uL (ref 0.7–4.0)
MCHC: 34.9 g/dL (ref 30.0–36.0)
MCV: 91.8 fl (ref 78.0–100.0)
MONO ABS: 0.4 10*3/uL (ref 0.1–1.0)
MONOS PCT: 8.6 % (ref 3.0–12.0)
Neutro Abs: 2.9 10*3/uL (ref 1.4–7.7)
Neutrophils Relative %: 58.4 % (ref 43.0–77.0)
Platelets: 275 10*3/uL (ref 150.0–400.0)
RBC: 4.63 Mil/uL (ref 3.87–5.11)
RDW: 13.5 % (ref 11.5–15.5)
WBC: 5 10*3/uL (ref 4.0–10.5)

## 2016-05-21 LAB — COMPREHENSIVE METABOLIC PANEL
ALBUMIN: 4.5 g/dL (ref 3.5–5.2)
ALT: 10 U/L (ref 0–35)
AST: 16 U/L (ref 0–37)
Alkaline Phosphatase: 57 U/L (ref 39–117)
BUN: 9 mg/dL (ref 6–23)
CHLORIDE: 103 meq/L (ref 96–112)
CO2: 26 mEq/L (ref 19–32)
Calcium: 9.3 mg/dL (ref 8.4–10.5)
Creatinine, Ser: 0.78 mg/dL (ref 0.40–1.20)
GFR: 87.43 mL/min (ref >60.00)
Glucose, Bld: 78 mg/dL (ref 70–99)
POTASSIUM: 4.3 meq/L (ref 3.5–5.1)
SODIUM: 138 meq/L (ref 135–145)
Total Bilirubin: 0.5 mg/dL (ref 0.2–1.2)
Total Protein: 7.1 g/dL (ref 6.0–8.3)

## 2016-05-21 LAB — LIPID PANEL
CHOL/HDL RATIO: 2
Cholesterol: 162 mg/dL (ref 0–200)
HDL: 67.8 mg/dL (ref >39.00)
LDL CALC: 78 mg/dL (ref 0–99)
NONHDL: 94.03
Triglycerides: 81 mg/dL (ref 0.0–149.0)
VLDL: 16.2 mg/dL (ref 0.0–40.0)

## 2016-05-21 LAB — TSH: TSH: 0.92 u[IU]/mL (ref 0.35–4.50)

## 2016-05-30 ENCOUNTER — Encounter: Payer: Self-pay | Admitting: Family Medicine

## 2016-05-30 ENCOUNTER — Ambulatory Visit (INDEPENDENT_AMBULATORY_CARE_PROVIDER_SITE_OTHER): Payer: BC Managed Care – PPO | Admitting: Family Medicine

## 2016-05-30 VITALS — BP 126/86 | HR 84 | Temp 98.8°F | Ht 69.5 in | Wt 176.8 lb

## 2016-05-30 DIAGNOSIS — Z0000 Encounter for general adult medical examination without abnormal findings: Secondary | ICD-10-CM | POA: Diagnosis not present

## 2016-05-30 DIAGNOSIS — Z23 Encounter for immunization: Secondary | ICD-10-CM

## 2016-05-30 DIAGNOSIS — Z8619 Personal history of other infectious and parasitic diseases: Secondary | ICD-10-CM | POA: Diagnosis not present

## 2016-05-30 DIAGNOSIS — Z803 Family history of malignant neoplasm of breast: Secondary | ICD-10-CM

## 2016-05-30 MED ORDER — TETANUS-DIPHTH-ACELL PERTUSSIS 5-2.5-18.5 LF-MCG/0.5 IM SUSP
0.5000 mL | Freq: Once | INTRAMUSCULAR | Status: AC
  Administered 2016-05-30: 0.5 mL via INTRAMUSCULAR

## 2016-05-30 NOTE — Assessment & Plan Note (Signed)
Has px for prn valtrex that works well  We will refill prn

## 2016-05-30 NOTE — Patient Instructions (Addendum)
Call insurance regarding getting a baseline mammogram (if covered I would like to get that going)  Tell them you have strong family history of breast cancer  Try to put together an exercise program   Make an appointment for your gyn exam when convenient   Tdap vaccine today

## 2016-05-30 NOTE — Assessment & Plan Note (Signed)
Would like to get a baseline mammogram -pt will check with ins re coverage again

## 2016-05-30 NOTE — Progress Notes (Signed)
Pre visit review using our clinic review tool, if applicable. No additional management support is needed unless otherwise documented below in the visit note. 

## 2016-05-30 NOTE — Progress Notes (Signed)
Subjective:    Patient ID: Margaret [name], female    DOB: September 25, 1975, 40 y.o.   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   Wt Readings from Last 3 Encounters:  05/30/16 176 lb 12 oz (80.2 kg)  07/23/15 177 lb 8 oz (80.5 kg)  05/30/15 174 lb 8 oz (79.2 kg)  stable  Not a lot of time to take care of herself  Plans to change that  bmi 25.7  Tetanus shot 1/08 Wants to do today   Pap 12/16 She is having menses today and wants to put off pap  Just started menses-needs to put off pap  2 heavy days -then ok -no problems/ not bothersome  Usually regular - but came early this time  Not using any contraception  husb had a vasectomy    Had flu shot 10/17  Family hx of breast  cancer in her mother  She called and ins said they won't pay until 40  [facility] had breast cancer (she had the gene)    (did have her ovaries out)  She herself has been tested and does not have the gene    Results for orders placed or performed in visit on 05/21/16  Comprehensive metabolic panel  Result Value Ref Range   Sodium 138 135 - 145 mEq/L   Potassium 4.3 3.5 - 5.1 mEq/L   Chloride 103 96 - 112 mEq/L   CO2 26 19 - 32 mEq/L   Glucose, Bld 78 70 - 99 mg/dL   BUN 9 6 - 23 mg/dL   Creatinine, Ser 4.090.78 0.40 - 1.20 mg/dL   Total Bilirubin 0.5 0.2 - 1.2 mg/dL   Alkaline Phosphatase 57 39 - 117 U/L   AST 16 0 - 37 U/L   ALT 10 0 - 35 U/L   Total Protein 7.1 6.0 - 8.3 g/dL   Albumin 4.5 3.5 - 5.2 g/dL   Calcium 9.3 8.4 - 81.110.5 mg/dL   GFR 91.4787.43 >82.95>60.00 mL/min  CBC with Differential/Platelet  Result Value Ref Range   WBC 5.0 4.0 - 10.5 K/uL   RBC 4.63 3.87 - 5.11 Mil/uL   Hemoglobin 14.8 12.0 - 15.0 g/dL   HCT 62.142.5 30.836.0 - 65.746.0 %   MCV 91.8 78.0 - 100.0 fl   MCHC 34.9 30.0 - 36.0 g/dL   RDW 84.613.5 96.211.5 - 95.215.5 %   Platelets 275.0 150.0 - 400.0 K/uL   Neutrophils  Relative % 58.4 43.0 - 77.0 %   Lymphocytes Relative 30.4 12.0 - 46.0 %   Monocytes Relative 8.6 3.0 - 12.0 %   Eosinophils Relative 2.0 0.0 - 5.0 %   Basophils Relative 0.6 0.0 - 3.0 %   Neutro Abs 2.9 1.4 - 7.7 K/uL   Lymphs Abs 1.5 0.7 - 4.0 K/uL   Monocytes Absolute 0.4 0.1 - 1.0 K/uL   Eosinophils Absolute 0.1 0.0 - 0.7 K/uL   Basophils Absolute 0.0 0.0 - 0.1 K/uL  Lipid panel  Result Value Ref Range   Cholesterol 162 0 - 200 mg/dL   Triglycerides 84.181.0 0.0 - 149.0 mg/dL   HDL 32.4467.80 >01.02>39.00 mg/dL   VLDL 72.516.2 0.0 - 36.640.0 mg/dL   LDL Cholesterol 78 0 - 99 mg/dL   Total CHOL/HDL Ratio 2    NonHDL 94.03   TSH  Result Value Ref Range  TSH 0.92 0.35 - 4.50 uIU/mL    Very good cholesterol  Dislikes exercise - but has an elliptical she can start using after work    Patient Active Problem List   Diagnosis Date Noted  . History of cold sores 05/30/2016  . Family history of breast cancer 05/30/2015  . Pre-employment examination 12/12/2013  . Encounter for routine gynecological examination 04/16/2011  . Routine general medical examination at a health care facility 04/08/2011  . History of depression 12/05/2008  . GERD 12/05/2008   Past Medical History:  Diagnosis Date  . Anemia   . Depressive disorder, not elsewhere classified   . Esophageal reflux   . HPV-genital warts   . Pure hypercholesterolemia    Past Surgical History:  Procedure Laterality Date  . chin implant    . COLONOSCOPY  11/19/10   normal  . FLEXIBLE SIGMOIDOSCOPY  11/19/10   normal  . RHINOPLASTY    . TONSILLECTOMY  1985   Social History  Substance Use Topics  . Smoking status: Never Smoker  . Smokeless tobacco: Never Used  . Alcohol use 0.0 oz/week     Comment: occasional   Family History  Problem Relation Age of Onset  . Alcohol abuse Father   . Diabetes Father   . Hyperlipidemia Father   . Hypertension Father   . Arthritis      parent  . Breast cancer Mother     < 50  . Depression Mother    . Arthritis Mother     Degenerative  . Colon cancer Maternal Grandfather     < 60  . Breast cancer Sister     BRACA gene    Allergies  Allergen Reactions  . Codeine     REACTION: nausea   No current outpatient prescriptions on file prior to visit.   No current facility-administered medications on file prior to visit.     Review of Systems    Review of Systems  Constitutional: Negative for fever, appetite change,  and unexpected weight change. pos for fatigue from work schedule  Eyes: Negative for pain and visual disturbance.  Respiratory: Negative for cough and shortness of breath.   Cardiovascular: Negative for cp or palpitations    Gastrointestinal: Negative for nausea, diarrhea and constipation.  Genitourinary: Negative for urgency and frequency.  Skin: Negative for pallor or rash  pos for occ cold sores MSK pos for knee pain after exercise that responds to ice  Neurological: Negative for weakness, light-headedness, numbness and headaches.  Hematological: Negative for adenopathy. Does not bruise/bleed easily.  Psychiatric/Behavioral: Negative for dysphoric mood. The patient is not nervous/anxious.  mood has been good     Objective:   Physical Exam  Constitutional: She appears well-developed and well-nourished. No distress.  Well appearing   HENT:  Head: Normocephalic and atraumatic.  Right Ear: External ear normal.  Left Ear: External ear normal.  Nose: Nose normal.  Mouth/Throat: Oropharynx is clear and moist.  Eyes: Conjunctivae and EOM are normal. Pupils are equal, round, and reactive to light. Right eye exhibits no discharge. Left eye exhibits no discharge. No scleral icterus.  Neck: Normal range of motion. Neck supple. No JVD present. Carotid bruit is not present. No thyromegaly present.  Cardiovascular: Normal rate, regular rhythm, normal heart sounds and intact distal pulses.  Exam reveals no gallop.   Pulmonary/Chest: Effort normal and breath sounds normal.  No respiratory distress. She has no wheezes. She has no rales.  Abdominal: Soft. Bowel sounds are normal.  She exhibits no distension and no mass. There is no tenderness.  Genitourinary:  Genitourinary Comments: Put off gyn exam for next visit due to early menses  Musculoskeletal: She exhibits no edema or tenderness.  Lymphadenopathy:    She has no cervical adenopathy.  Neurological: She is alert. She has normal reflexes. No cranial nerve deficit. She exhibits normal muscle tone. Coordination normal.  Skin: Skin is warm and dry. No rash noted. No erythema. No pallor.  Few lentigines   Psychiatric: She has a normal mood and affect.  Cheerful and talkative           Assessment & Plan:   Problem List Items Addressed This Visit      Other   Routine general medical examination at a health care facility - Primary    Reviewed health habits including diet and exercise and skin cancer prevention Reviewed appropriate screening tests for age  Also reviewed health mt list, fam hx and immunization status , as well as social and family history   See HPI Encouraged starting an exercise program to help sleep and energy level  Enc balanced diet Pt has menses today and will f/u for  Gyn exam/pap when able  Tdap vaccine today  Her last ins co would not pay for baseline mammogram (strong fam hx of breast cancer) New one may-she will find out and let us know        History of cold sores    Has px for prn valtrex that works well  We will refill prn       Family history of breast cancer    Would like to get a baseline mammogram -pt will check with ins re coverage again

## 2016-05-30 NOTE — Assessment & Plan Note (Signed)
Reviewed health habits including diet and exercise and skin cancer prevention Reviewed appropriate screening tests for age  Also reviewed health mt list, fam hx and immunization status , as well as social and family history   See HPI Encouraged starting an exercise program to help sleep and energy level  Enc balanced diet Pt has menses today and will f/u for  Gyn exam/pap when able  Tdap vaccine today  Her last ins co would not pay for baseline mammogram (strong fam hx of breast cancer) New one may-she will find out and let us know

## 2016-06-06 ENCOUNTER — Other Ambulatory Visit: Payer: Self-pay

## 2016-06-06 MED ORDER — VALACYCLOVIR HCL 1 G PO TABS: ORAL_TABLET | ORAL | 3 refills | Status: DC

## 2016-06-06 NOTE — Telephone Encounter (Signed)
Will refill electronically  

## 2016-06-06 NOTE — Telephone Encounter (Signed)
Pt is calling to get a prescription for valtrex for fever blisters.

## 2016-06-16 ENCOUNTER — Ambulatory Visit: Payer: BC Managed Care – PPO | Admitting: Family Medicine

## 2016-07-10 ENCOUNTER — Encounter: Payer: Self-pay | Admitting: Family Medicine

## 2016-07-10 MED ORDER — VALACYCLOVIR HCL 1 G PO TABS: ORAL_TABLET | ORAL | 3 refills | Status: DC

## 2016-09-19 ENCOUNTER — Encounter: Payer: Self-pay | Admitting: Family Medicine

## 2016-09-19 ENCOUNTER — Ambulatory Visit (INDEPENDENT_AMBULATORY_CARE_PROVIDER_SITE_OTHER): Payer: BC Managed Care – PPO | Admitting: Family Medicine

## 2016-09-19 VITALS — BP 110/90 | HR 91 | Temp 97.6°F | Ht 69.5 in | Wt 183.5 lb

## 2016-09-19 DIAGNOSIS — J0100 Acute maxillary sinusitis, unspecified: Secondary | ICD-10-CM

## 2016-09-19 DIAGNOSIS — J309 Allergic rhinitis, unspecified: Secondary | ICD-10-CM | POA: Insufficient documentation

## 2016-09-19 DIAGNOSIS — J301 Allergic rhinitis due to pollen: Secondary | ICD-10-CM

## 2016-09-19 MED ORDER — FLUTICASONE PROPIONATE 50 MCG/ACT NA SUSP: 2.0000 | Freq: Every day | NASAL | 0 refills | Status: DC

## 2016-09-19 MED ORDER — PREDNISONE 20 MG PO TABS: ORAL_TABLET | ORAL | 0 refills | Status: DC

## 2016-09-19 NOTE — Progress Notes (Signed)
Pre visit review using our clinic review tool, if applicable. No additional management support is needed unless otherwise documented below in the visit note. 

## 2016-09-19 NOTE — Assessment & Plan Note (Signed)
Viral and allergic. No sign of bacterial infection. Treat with pred taper, nasal saline irrigation. Stop afrin and transition to nasal steroid spray.

## 2016-09-19 NOTE — Assessment & Plan Note (Signed)
Treat with zyrtec daily.

## 2016-09-19 NOTE — Patient Instructions (Signed)
Start course of prednisone .  Start nasal saline spray 2-3 times daily in addition to mucinex DM  Once completed  prednsionestart fluticasone nasal spray. Continue zyrtec for allergy.  Call if any fever , unilaterall face pain or shortness of breath.

## 2016-09-19 NOTE — Progress Notes (Signed)
   Subjective:    Patient ID: Margaret [name], female    DOB: May 08, 1976, 41 y.o.   MRN: [id number]   Started with sore throat 2 weeks ago.. Progressed to congetion, now deep cough, mucus yellow.  Most bothered by decrease hearing in B ears, no ear pain.   Cough  This is a new problem. The current episode started 1 to 4 weeks ago. The problem has been gradually worsening. The cough is productive of purulent sputum. Associated symptoms include ear congestion, nasal congestion, postnasal drip and a sore throat. Pertinent negatives include no chills, ear pain, fever, myalgias, rash or shortness of breath. Associated symptoms comments: Sinus pressure , no pain. The symptoms are aggravated by lying down. Treatments tried: using nyquil and mucinem using zyrtec, using afrin twice daily  1 week. Her past medical history is significant for environmental allergies. There is no history of asthma, bronchitis or COPD.      Review of Systems  Constitutional: Negative for chills and fever.  HENT: Positive for postnasal drip and sore throat. Negative for ear pain.   Respiratory: Positive for cough. Negative for shortness of breath.   Musculoskeletal: Negative for myalgias.  Skin: Negative for rash.  Allergic/Immunologic: Positive for environmental allergies.       Objective:   Physical Exam  Constitutional: Vital signs are normal. She appears well-developed and well-nourished. She is cooperative.  Non-toxic appearance. She does not appear ill. No distress.  HENT:  Head: Normocephalic.  Right Ear: Hearing, tympanic membrane, external ear and ear canal normal. Tympanic membrane is not erythematous, not retracted and not bulging.  Left Ear: Hearing, tympanic membrane, external ear and ear canal normal. Tympanic membrane is not erythematous, not retracted and not bulging.  Nose: Mucosal edema and rhinorrhea present. Right sinus exhibits no maxillary sinus tenderness and no frontal sinus tenderness. Left  sinus exhibits no maxillary sinus tenderness and no frontal sinus tenderness.  Mouth/Throat: Uvula is midline, oropharynx is clear and moist and mucous membranes are normal.  Eyes: Conjunctivae, EOM and lids are normal. Pupils are equal, round, and reactive to light. Lids are everted and swept, no foreign bodies found.  Neck: Trachea normal and normal range of motion. Neck supple. Carotid bruit is not present. No thyroid mass and no thyromegaly present.  Cardiovascular: Normal rate, regular rhythm, S1 normal, S2 normal, normal heart sounds, intact distal pulses and normal pulses.  Exam reveals no gallop and no friction rub.   No murmur heard. Pulmonary/Chest: Effort normal and breath sounds normal. No tachypnea. No respiratory distress. She has no decreased breath sounds. She has no wheezes. She has no rhonchi. She has no rales.  Neurological: She is alert.  Skin: Skin is warm, dry and intact. No rash noted.  Psychiatric: Her speech is normal and behavior is normal. Judgment normal. Her mood appears not anxious. Cognition and memory are normal. She does not exhibit a depressed mood.          Assessment & Plan:

## 2016-10-21 ENCOUNTER — Other Ambulatory Visit: Payer: Self-pay | Admitting: Family Medicine

## 2016-10-26 ENCOUNTER — Telehealth: Payer: Self-pay | Admitting: Family Medicine

## 2016-10-26 DIAGNOSIS — Z0000 Encounter for general adult medical examination without abnormal findings: Secondary | ICD-10-CM

## 2016-10-26 NOTE — Telephone Encounter (Signed)
-----   Message from Alvina Chouerri J Walsh sent at 10/20/2016  4:18 PM EDT ----- Regarding: Lab orders for Thursday, 5.31.18 Patient is scheduled for CPX labs, please order future labs, Thanks , Camelia Engerri

## 2016-10-30 ENCOUNTER — Other Ambulatory Visit (INDEPENDENT_AMBULATORY_CARE_PROVIDER_SITE_OTHER): Payer: BC Managed Care – PPO

## 2016-10-30 DIAGNOSIS — Z0000 Encounter for general adult medical examination without abnormal findings: Secondary | ICD-10-CM | POA: Diagnosis not present

## 2016-10-30 LAB — CBC WITH DIFFERENTIAL/PLATELET
BASOS PCT: 0.5 % (ref 0.0–3.0)
Basophils Absolute: 0 10*3/uL (ref 0.0–0.1)
EOS PCT: 2.5 % (ref 0.0–5.0)
Eosinophils Absolute: 0.1 10*3/uL (ref 0.0–0.7)
HCT: 41.5 % (ref 36.0–46.0)
HEMOGLOBIN: 14.6 g/dL (ref 12.0–15.0)
LYMPHS PCT: 23.8 % (ref 12.0–46.0)
Lymphs Abs: 1.2 10*3/uL (ref 0.7–4.0)
MCHC: 35.3 g/dL (ref 30.0–36.0)
MCV: 92.4 fl (ref 78.0–100.0)
MONO ABS: 0.4 10*3/uL (ref 0.1–1.0)
MONOS PCT: 8.7 % (ref 3.0–12.0)
Neutro Abs: 3.3 10*3/uL (ref 1.4–7.7)
Neutrophils Relative %: 64.5 % (ref 43.0–77.0)
Platelets: 258 10*3/uL (ref 150.0–400.0)
RBC: 4.49 Mil/uL (ref 3.87–5.11)
RDW: 13.1 % (ref 11.5–15.5)
WBC: 5.2 10*3/uL (ref 4.0–10.5)

## 2016-10-30 LAB — COMPREHENSIVE METABOLIC PANEL
ALBUMIN: 4.5 g/dL (ref 3.5–5.2)
ALK PHOS: 59 U/L (ref 39–117)
ALT: 10 U/L (ref 0–35)
AST: 15 U/L (ref 0–37)
BUN: 10 mg/dL (ref 6–23)
CHLORIDE: 106 meq/L (ref 96–112)
CO2: 29 mEq/L (ref 19–32)
Calcium: 9.5 mg/dL (ref 8.4–10.5)
Creatinine, Ser: 0.85 mg/dL (ref 0.40–1.20)
GFR: 78.99 mL/min (ref >60.00)
Glucose, Bld: 100 mg/dL — ABNORMAL HIGH (ref 70–99)
POTASSIUM: 5.1 meq/L (ref 3.5–5.1)
SODIUM: 138 meq/L (ref 135–145)
TOTAL PROTEIN: 7 g/dL (ref 6.0–8.3)
Total Bilirubin: 0.3 mg/dL (ref 0.2–1.2)

## 2016-10-30 LAB — LIPID PANEL
CHOL/HDL RATIO: 2
Cholesterol: 169 mg/dL (ref 0–200)
HDL: 68.4 mg/dL (ref >39.00)
LDL CALC: 84 mg/dL (ref 0–99)
NonHDL: 100.92
Triglycerides: 84 mg/dL (ref 0.0–149.0)
VLDL: 16.8 mg/dL (ref 0.0–40.0)

## 2016-10-30 LAB — TSH: TSH: 1.43 u[IU]/mL (ref 0.35–4.50)

## 2016-11-03 ENCOUNTER — Encounter: Payer: Self-pay | Admitting: Family Medicine

## 2016-11-03 ENCOUNTER — Ambulatory Visit (INDEPENDENT_AMBULATORY_CARE_PROVIDER_SITE_OTHER): Payer: BC Managed Care – PPO | Admitting: Family Medicine

## 2016-11-03 ENCOUNTER — Other Ambulatory Visit (HOSPITAL_COMMUNITY)
Admission: RE | Admit: 2016-11-03 | Discharge: 2016-11-03 | Disposition: A | Discharge status: Home / Self Care | Payer: BC Managed Care – PPO | Source: Ambulatory Visit | Attending: Family Medicine | Admitting: Family Medicine

## 2016-11-03 VITALS — BP 118/76 | HR 85 | Temp 98.4°F | Ht 69.25 in | Wt 179.2 lb

## 2016-11-03 DIAGNOSIS — Z0000 Encounter for general adult medical examination without abnormal findings: Secondary | ICD-10-CM

## 2016-11-03 DIAGNOSIS — Z803 Family history of malignant neoplasm of breast: Secondary | ICD-10-CM

## 2016-11-03 DIAGNOSIS — Z01419 Encounter for gynecological examination (general) (routine) without abnormal findings: Secondary | ICD-10-CM

## 2016-11-03 DIAGNOSIS — Z1231 Encounter for screening mammogram for malignant neoplasm of breast: Secondary | ICD-10-CM

## 2016-11-03 NOTE — Progress Notes (Signed)
Subjective:    Patient ID: Margaret [name], female    DOB: 24-May-1976, 41 y.o.   MRN: [id number]  HPI Here for health maintenance exam and to review chronic medical problems    Doing well Taking some classes - to move to a new position  Will have a little vacation time    Wt Readings from Last 3 Encounters:  11/03/16 179 lb 4 oz (81.3 kg)  09/19/16 183 lb 8 oz (83.2 kg)  05/30/16 176 lb 12 oz (80.2 kg)  she would like to loose more weight  Getting more exercise- walking /elliptical  bmi 26.2  Flu shot this fall  Tetanus shot 12/17  Pap 12/16 neg with HPV negative  Had menses at her visit in dec-needs to do her gyn exam  Turns 42 in January Wants to get ref for mammogram this summer - wants to get 3D   Mother had breast cancer under the age of 39 [facility]  MGF had colon cancer under the age of 460   2902 flex sig   Labs look good  Results for orders placed or performed in visit on 10/30/16  Comprehensive metabolic panel  Result Value Ref Range   Sodium 138 135 - 145 mEq/L   Potassium 5.1 3.5 - 5.1 mEq/L   Chloride 106 96 - 112 mEq/L   CO2 29 19 - 32 mEq/L   Glucose, Bld 100 (H) 70 - 99 mg/dL   BUN 10 6 - 23 mg/dL   Creatinine, Ser 4.030.85 0.40 - 1.20 mg/dL   Total Bilirubin 0.3 0.2 - 1.2 mg/dL   Alkaline Phosphatase 59 39 - 117 U/L   AST 15 0 - 37 U/L   ALT 10 0 - 35 U/L   Total Protein 7.0 6.0 - 8.3 g/dL   Albumin 4.5 3.5 - 5.2 g/dL   Calcium 9.5 8.4 - 47.410.5 mg/dL   GFR 25.9578.99 >63.87>60.00 mL/min  CBC with Differential/Platelet  Result Value Ref Range   WBC 5.2 4.0 - 10.5 K/uL   RBC 4.49 3.87 - 5.11 Mil/uL   Hemoglobin 14.6 12.0 - 15.0 g/dL   HCT 56.441.5 33.236.0 - 95.146.0 %   MCV 92.4 78.0 - 100.0 fl   MCHC 35.3 30.0 - 36.0 g/dL   RDW 88.413.1 16.611.5 - 06.315.5 %   Platelets 258.0 150.0 - 400.0 K/uL   Neutrophils Relative % 64.5 43.0 - 77.0 %   Lymphocytes Relative 23.8 12.0 - 46.0 %   Monocytes Relative 8.7 3.0 - 12.0 %   Eosinophils Relative 2.5 0.0 - 5.0 %   Basophils Relative 0.5 0.0 - 3.0 %   Neutro Abs 3.3 1.4 - 7.7 K/uL   Lymphs Abs 1.2 0.7 - 4.0 K/uL   Monocytes Absolute 0.4 0.1 - 1.0 K/uL   Eosinophils Absolute 0.1 0.0 - 0.7 K/uL   Basophils Absolute 0.0 0.0 - 0.1 K/uL  Lipid panel  Result Value Ref Range   Cholesterol 169 0 - 200 mg/dL   Triglycerides 01.684.0 0.0 - 149.0 mg/dL   HDL 01.0968.40 >32.35>39.00 mg/dL   VLDL 57.316.8 0.0 - 22.040.0 mg/dL   LDL Cholesterol 84 0 - 99 mg/dL   Total CHOL/HDL Ratio 2    NonHDL 100.92   TSH  Result Value Ref Range   TSH 1.43 0.35 - 4.50 uIU/mL    Patient Active Problem List   Diagnosis Date Noted  . Screening mammogram, encounter for 11/03/2016  . Encounter for gynecological examination (general) (routine) without  abnormal findings 11/03/2016  . Allergic rhinitis 09/19/2016  . History of cold sores 05/30/2016  . Family history of breast cancer 05/30/2015  . Pre-employment examination 12/12/2013  . Encounter for routine gynecological examination 04/16/2011  . Routine general medical examination at a health care facility 04/08/2011  . History of depression 12/05/2008  . GERD 12/05/2008   Past Medical History:  Diagnosis Date  . Anemia   . Depressive disorder, not elsewhere classified   . Esophageal reflux   . HPV-genital warts   . Pure hypercholesterolemia    Past Surgical History:  Procedure Laterality Date  . chin implant    . COLONOSCOPY  11/19/10   normal  . FLEXIBLE SIGMOIDOSCOPY  11/19/10   normal  . RHINOPLASTY    . TONSILLECTOMY  1985   Social History  Substance Use Topics  . Smoking status: Never Smoker  . Smokeless tobacco: Never Used  . Alcohol use 0.0 oz/week     Comment: occasional   Family History  Problem Relation Age of Onset  . Alcohol abuse Father   . Diabetes Father   . Hyperlipidemia Father   . Hypertension Father   . Arthritis Unknown        parent  . Breast cancer Mother        < 50  . Depression Mother   . Arthritis Mother        Degenerative  . Colon cancer  Maternal Grandfather        < 60  . Breast cancer Sister        BRACA gene    Allergies  Allergen Reactions  . Codeine     REACTION: nausea   Current Outpatient Prescriptions on File Prior to Visit  Medication Sig Dispense Refill  . fluticasone (FLONASE) 50 MCG/ACT nasal spray PLACE 2 SPRAYS INTO BOTH NOSTRILS DAILY. 16 g 11  . valACYclovir (VALTREX) 1000 MG tablet TAKE TWO PILLS (2 GM) EVERY 12 HOURS X 1 DAY AT ONSET OF SYMPTOMS 12 tablet 3   No current facility-administered medications on file prior to visit.     Review of Systems Review of Systems  Constitutional: Negative for fever, appetite change, fatigue and unexpected weight change.  Eyes: Negative for pain and visual disturbance.  Respiratory: Negative for cough and shortness of breath.   Cardiovascular: Negative for cp or palpitations    Gastrointestinal: Negative for nausea, diarrhea and constipation.  Genitourinary: Negative for urgency and frequency.  Skin: Negative for pallor or rash   Neurological: Negative for weakness, light-headedness, numbness and headaches.  Hematological: Negative for adenopathy. Does not bruise/bleed easily.  Psychiatric/Behavioral: Negative for dysphoric mood. The patient is not nervous/anxious.         Objective:   Physical Exam  Constitutional: She appears well-developed and well-nourished. No distress.  HENT:  Head: Normocephalic and atraumatic.  Right Ear: External ear normal.  Left Ear: External ear normal.  Mouth/Throat: Oropharynx is clear and moist.  Eyes: Conjunctivae and EOM are normal. Pupils are equal, round, and reactive to light. No scleral icterus.  Neck: Normal range of motion. Neck supple. No JVD present. Carotid bruit is not present. No thyromegaly present.  Cardiovascular: Normal rate, regular rhythm, normal heart sounds and intact distal pulses.  Exam reveals no gallop.   Pulmonary/Chest: Effort normal and breath sounds normal. No respiratory distress. She has no  wheezes. She exhibits no tenderness.  Abdominal: Soft. Bowel sounds are normal. She exhibits no distension, no abdominal bruit and no mass. There is  no tenderness.  Genitourinary: No breast swelling, tenderness, discharge or bleeding.  Genitourinary Comments: Breast exam: No mass, nodules, thickening, tenderness, bulging, retraction, inflamation, nipple discharge or skin changes noted.  No axillary or clavicular LA.             Anus appears normal w/o hemorrhoids or masses     External genitalia : nl appearance and hair distribution/no lesions     Urethral meatus : nl size, no lesions or prolapse     Urethra: no masses, tenderness or scarring    Bladder : no masses or tenderness     Vagina: nl general appearance, no discharge or  Lesions, no significant cystocele  or rectocele     Cervix: no lesions/ discharge or friability  scant spotting from menses    Uterus: nl size, contour, position, and mobility (not fixed) , non tender    Adnexa : no masses, tenderness, enlargement or nodularity        Musculoskeletal: Normal range of motion. She exhibits no edema or tenderness.  Lymphadenopathy:    She has no cervical adenopathy.  Neurological: She is alert. She has normal reflexes. No cranial nerve deficit. She exhibits normal muscle tone. Coordination normal.  Skin: Skin is warm and dry. No rash noted. No erythema. No pallor.  Solar lentigines diffusely   Psychiatric: She has a normal mood and affect.          Assessment & Plan:   Problem List Items Addressed This Visit      Other   Encounter for gynecological examination (general) (routine) without abnormal findings    Exam and pap today  No c/o or abn (some spotting with pap) Contraception is vasectomy        Relevant Orders   Cytology - PAP   Family history of breast cancer    Refer for first screening mammogram Mother had breast cancer before age 49      Routine general medical examination at a  health care facility - Primary    Reviewed health habits including diet and exercise and skin cancer prevention Reviewed appropriate screening tests for age  Also reviewed health mt list, fam hx and immunization status , as well as social and family history   See HPI Labs reviewed Ref for mammogram Gyn exam done with pap Enc healthy diet and exercise      Screening mammogram, encounter for    Scheduled annual screening mammogram Nl breast exam today  Encouraged monthly self exams   fam hx of early breast cancer -mother under age of 33       Relevant Orders   MM DIGITAL SCREENING BILATERAL

## 2016-11-03 NOTE — Assessment & Plan Note (Signed)
Scheduled annual screening mammogram Nl breast exam today  Encouraged monthly self exams   fam hx of early breast cancer -mother under age of 39

## 2016-11-03 NOTE — Assessment & Plan Note (Signed)
Reviewed health habits including diet and exercise and skin cancer prevention Reviewed appropriate screening tests for age  Also reviewed health mt list, fam hx and immunization status , as well as social and family history   See HPI Labs reviewed Ref for mammogram Gyn exam done with pap Enc healthy diet and exercise

## 2016-11-03 NOTE — Patient Instructions (Signed)
Take care of yourself  Stay active  Wear sun protection   Labs look good  Pap today

## 2016-11-03 NOTE — Assessment & Plan Note (Signed)
Refer for first screening mammogram Mother had breast cancer before age 39

## 2016-11-03 NOTE — Assessment & Plan Note (Signed)
Exam and pap today  No c/o or abn (some spotting with pap) Contraception is vasectomy

## 2016-11-05 LAB — CYTOLOGY - PAP
DIAGNOSIS: NEGATIVE
HPV: NOT DETECTED

## 2016-12-08 ENCOUNTER — Ambulatory Visit
Admission: RE | Admit: 2016-12-08 | Discharge: 2016-12-08 | Disposition: A | Discharge status: Home / Self Care | Payer: BC Managed Care – PPO | Source: Ambulatory Visit | Attending: Family Medicine | Admitting: Family Medicine

## 2016-12-08 DIAGNOSIS — Z1231 Encounter for screening mammogram for malignant neoplasm of breast: Secondary | ICD-10-CM

## 2016-12-09 ENCOUNTER — Other Ambulatory Visit: Payer: Self-pay | Admitting: Family Medicine

## 2016-12-09 DIAGNOSIS — R928 Other abnormal and inconclusive findings on diagnostic imaging of breast: Secondary | ICD-10-CM

## 2016-12-18 ENCOUNTER — Other Ambulatory Visit: Payer: Self-pay | Admitting: Family Medicine

## 2016-12-18 ENCOUNTER — Ambulatory Visit: Admission: RE | Admit: 2016-12-18 | Payer: BC Managed Care – PPO | Source: Ambulatory Visit

## 2016-12-18 ENCOUNTER — Ambulatory Visit
Admission: RE | Admit: 2016-12-18 | Discharge: 2016-12-18 | Disposition: A | Discharge status: Home / Self Care | Payer: BC Managed Care – PPO | Source: Ambulatory Visit | Attending: Family Medicine | Admitting: Family Medicine

## 2016-12-18 DIAGNOSIS — R921 Mammographic calcification found on diagnostic imaging of breast: Secondary | ICD-10-CM

## 2016-12-18 DIAGNOSIS — R928 Other abnormal and inconclusive findings on diagnostic imaging of breast: Secondary | ICD-10-CM

## 2016-12-19 ENCOUNTER — Ambulatory Visit
Admission: RE | Admit: 2016-12-19 | Discharge: 2016-12-19 | Disposition: A | Discharge status: Home / Self Care | Payer: BC Managed Care – PPO | Source: Ambulatory Visit | Attending: Family Medicine | Admitting: Family Medicine

## 2016-12-19 ENCOUNTER — Other Ambulatory Visit: Payer: Self-pay | Admitting: Family Medicine

## 2016-12-19 DIAGNOSIS — R928 Other abnormal and inconclusive findings on diagnostic imaging of breast: Secondary | ICD-10-CM

## 2016-12-19 DIAGNOSIS — R921 Mammographic calcification found on diagnostic imaging of breast: Secondary | ICD-10-CM

## 2016-12-19 HISTORY — PX: BREAST BIOPSY: SHX20

## 2017-02-06 ENCOUNTER — Other Ambulatory Visit: Payer: Self-pay | Admitting: Family Medicine

## 2017-05-01 ENCOUNTER — Other Ambulatory Visit: Payer: Self-pay | Admitting: Family Medicine

## 2017-08-05 ENCOUNTER — Encounter: Payer: Self-pay | Admitting: Family Medicine

## 2017-08-10 MED ORDER — VALACYCLOVIR HCL 1 G PO TABS: ORAL_TABLET | ORAL | 3 refills | Status: DC

## 2017-11-01 ENCOUNTER — Telehealth: Payer: Self-pay | Admitting: Family Medicine

## 2017-11-01 DIAGNOSIS — Z0000 Encounter for general adult medical examination without abnormal findings: Secondary | ICD-10-CM

## 2017-11-01 NOTE — Telephone Encounter (Signed)
-----   Message from Wendi MayaLauren Greeson, RT sent at 10/27/2017  9:28 AM EDT ----- Regarding: Lab orders for Monday, June 3rd Please enter CPE lab orders for June 3rd. Thanks-Lauren

## 2017-11-02 ENCOUNTER — Other Ambulatory Visit (INDEPENDENT_AMBULATORY_CARE_PROVIDER_SITE_OTHER): Payer: BC Managed Care – PPO

## 2017-11-02 DIAGNOSIS — Z0000 Encounter for general adult medical examination without abnormal findings: Secondary | ICD-10-CM | POA: Diagnosis not present

## 2017-11-02 LAB — COMPREHENSIVE METABOLIC PANEL
ALT: 11 U/L (ref 0–35)
AST: 15 U/L (ref 0–37)
Albumin: 4.5 g/dL (ref 3.5–5.2)
Alkaline Phosphatase: 60 U/L (ref 39–117)
BUN: 14 mg/dL (ref 6–23)
CALCIUM: 9.5 mg/dL (ref 8.4–10.5)
CHLORIDE: 103 meq/L (ref 96–112)
CO2: 27 meq/L (ref 19–32)
CREATININE: 0.79 mg/dL (ref 0.40–1.20)
GFR: 85.52 mL/min (ref >60.00)
Glucose, Bld: 91 mg/dL (ref 70–99)
Potassium: 4.6 mEq/L (ref 3.5–5.1)
Sodium: 136 mEq/L (ref 135–145)
Total Bilirubin: 0.6 mg/dL (ref 0.2–1.2)
Total Protein: 7.2 g/dL (ref 6.0–8.3)

## 2017-11-02 LAB — LIPID PANEL
CHOL/HDL RATIO: 3
Cholesterol: 187 mg/dL (ref 0–200)
HDL: 64.9 mg/dL (ref >39.00)
LDL CALC: 94 mg/dL (ref 0–99)
NonHDL: 122.13
TRIGLYCERIDES: 141 mg/dL (ref 0.0–149.0)
VLDL: 28.2 mg/dL (ref 0.0–40.0)

## 2017-11-02 LAB — CBC WITH DIFFERENTIAL/PLATELET
BASOS PCT: 0.5 % (ref 0.0–3.0)
Basophils Absolute: 0 10*3/uL (ref 0.0–0.1)
Eosinophils Absolute: 0.1 10*3/uL (ref 0.0–0.7)
Eosinophils Relative: 2 % (ref 0.0–5.0)
HEMATOCRIT: 43.2 % (ref 36.0–46.0)
Hemoglobin: 14.9 g/dL (ref 12.0–15.0)
LYMPHS PCT: 33.8 % (ref 12.0–46.0)
Lymphs Abs: 2 10*3/uL (ref 0.7–4.0)
MCHC: 34.6 g/dL (ref 30.0–36.0)
MCV: 93.4 fl (ref 78.0–100.0)
MONOS PCT: 8.9 % (ref 3.0–12.0)
Monocytes Absolute: 0.5 10*3/uL (ref 0.1–1.0)
NEUTROS ABS: 3.3 10*3/uL (ref 1.4–7.7)
Neutrophils Relative %: 54.8 % (ref 43.0–77.0)
PLATELETS: 263 10*3/uL (ref 150.0–400.0)
RBC: 4.63 Mil/uL (ref 3.87–5.11)
RDW: 12.5 % (ref 11.5–15.5)
WBC: 5.9 10*3/uL (ref 4.0–10.5)

## 2017-11-02 LAB — TSH: TSH: 1.4 u[IU]/mL (ref 0.35–4.50)

## 2017-11-04 ENCOUNTER — Encounter: Payer: BC Managed Care – PPO | Admitting: Family Medicine

## 2017-11-05 ENCOUNTER — Encounter: Payer: Self-pay | Admitting: Family Medicine

## 2017-11-05 ENCOUNTER — Ambulatory Visit (INDEPENDENT_AMBULATORY_CARE_PROVIDER_SITE_OTHER): Payer: BC Managed Care – PPO | Admitting: Family Medicine

## 2017-11-05 VITALS — BP 126/78 | HR 97 | Temp 97.8°F | Ht 69.25 in | Wt 178.5 lb

## 2017-11-05 DIAGNOSIS — Z0000 Encounter for general adult medical examination without abnormal findings: Secondary | ICD-10-CM | POA: Diagnosis not present

## 2017-11-05 NOTE — Progress Notes (Signed)
Subjective:    Patient ID: Margaret [name], female    DOB: Oct 26, 1975, 42 y.o.   MRN: [id number]  HPI Here for health maintenance exam and to review chronic medical problems    Feeling good overall  Taking care of herself   School is up - and will start back in 6 weeks   Wt Readings from Last 3 Encounters:  11/05/17 178 lb 8 oz (81 kg)  11/03/16 179 lb 4 oz (81.3 kg)  09/19/16 183 lb 8 oz (83.2 kg)  tries to exercise- at least 2-3 times per week  Lost to 165 2 mo ago and then gained it back - more exercise/ then ate more with stress  26.17 kg/m   Has R knee pain - swelling Past sports / and injury (meniscus- years ago)  Wears a knee sleeve   Tetanus shot 12/17   Breast cancer screening - mammogram and neg biopsy Mother had breast cancer at age 16  Sister has BRAC gene  MGF had colon cancer before age 65   Flex sig 01  Flu shot 10/18   Pap 6/18 neg with neg HPV  Menses - every 3 weeks/   4-5 days  Not heavy or painful  Husband had vasectomy    Mammogram 7/18-- calcifications , had a neg biopsy Does not need a referral to go back  Self breast exam -no new lumps or changes   Labs   Cholesterol  Lab Results  Component Value Date   CHOL 187 11/02/2017   CHOL 169 10/30/2016   CHOL 162 05/21/2016   Lab Results  Component Value Date   HDL 64.90 11/02/2017   HDL 68.40 10/30/2016   HDL 67.80 05/21/2016   Lab Results  Component Value Date   LDLCALC 94 11/02/2017   LDLCALC 84 10/30/2016   LDLCALC 78 05/21/2016   Lab Results  Component Value Date   TRIG 141.0 11/02/2017   TRIG 84.0 10/30/2016   TRIG 81.0 05/21/2016   Lab Results  Component Value Date   CHOLHDL 3 11/02/2017   CHOLHDL 2 10/30/2016   CHOLHDL 2 05/21/2016   Lab Results  Component Value Date   LDLDIRECT 139.3 03/14/2009   Good profile  Eats well   Other labs Results for orders placed or performed in visit on 11/02/17  TSH  Result Value Ref Range   TSH 1.40 0.35 - 4.50 uIU/mL   Lipid panel  Result Value Ref Range   Cholesterol 187 0 - 200 mg/dL   Triglycerides 409.8 0.0 - 149.0 mg/dL   HDL 11.91 >47.82 mg/dL   VLDL 95.6 0.0 - 21.3 mg/dL   LDL Cholesterol 94 0 - 99 mg/dL   Total CHOL/HDL Ratio 3    NonHDL 122.13   Comprehensive metabolic panel  Result Value Ref Range   Sodium 136 135 - 145 mEq/L   Potassium 4.6 3.5 - 5.1 mEq/L   Chloride 103 96 - 112 mEq/L   CO2 27 19 - 32 mEq/L   Glucose, Bld 91 70 - 99 mg/dL   BUN 14 6 - 23 mg/dL   Creatinine, Ser 0.86 0.40 - 1.20 mg/dL   Total Bilirubin 0.6 0.2 - 1.2 mg/dL   Alkaline Phosphatase 60 39 - 117 U/L   AST 15 0 - 37 U/L   ALT 11 0 - 35 U/L   Total Protein 7.2 6.0 - 8.3 g/dL   Albumin 4.5 3.5 - 5.2 g/dL   Calcium 9.5 8.4 - 57.8 mg/dL  GFR 85.52 >60.00 mL/min  CBC with Differential/Platelet  Result Value Ref Range   WBC 5.9 4.0 - 10.5 K/uL   RBC 4.63 3.87 - 5.11 Mil/uL   Hemoglobin 14.9 12.0 - 15.0 g/dL   HCT 16.143.2 09.636.0 - 04.546.0 %   MCV 93.4 78.0 - 100.0 fl   MCHC 34.6 30.0 - 36.0 g/dL   RDW 40.912.5 81.111.5 - 91.415.5 %   Platelets 263.0 150.0 - 400.0 K/uL   Neutrophils Relative % 54.8 43.0 - 77.0 %   Lymphocytes Relative 33.8 12.0 - 46.0 %   Monocytes Relative 8.9 3.0 - 12.0 %   Eosinophils Relative 2.0 0.0 - 5.0 %   Basophils Relative 0.5 0.0 - 3.0 %   Neutro Abs 3.3 1.4 - 7.7 K/uL   Lymphs Abs 2.0 0.7 - 4.0 K/uL   Monocytes Absolute 0.5 0.1 - 1.0 K/uL   Eosinophils Absolute 0.1 0.0 - 0.7 K/uL   Basophils Absolute 0.0 0.0 - 0.1 K/uL     Patient Active Problem List   Diagnosis Date Noted  . Screening mammogram, encounter for 11/03/2016  . Encounter for gynecological examination (general) (routine) without abnormal findings 11/03/2016  . Allergic rhinitis 09/19/2016  . History of cold sores 05/30/2016  . Family history of breast cancer 05/30/2015  . Pre-employment examination 12/12/2013  . Encounter for routine gynecological examination 04/16/2011  . Routine general medical examination at a health  care facility 04/08/2011  . History of depression 12/05/2008  . GERD 12/05/2008   Past Medical History:  Diagnosis Date  . Anemia   . Depressive disorder, not elsewhere classified   . Esophageal reflux   . HPV-genital warts   . Pure hypercholesterolemia    Past Surgical History:  Procedure Laterality Date  . chin implant    . COLONOSCOPY  11/19/10   normal  . FLEXIBLE SIGMOIDOSCOPY  11/19/10   normal  . RHINOPLASTY    . TONSILLECTOMY  1985   Social History   Tobacco Use  . Smoking status: Never Smoker  . Smokeless tobacco: Never Used  Substance Use Topics  . Alcohol use: Yes    Alcohol/week: 0.0 oz    Comment: occasional  . Drug use: No   Family History  Problem Relation Age of Onset  . Alcohol abuse Father   . Diabetes Father   . Hyperlipidemia Father   . Hypertension Father   . Arthritis Unknown        parent  . Breast cancer Mother        < 50  . Depression Mother   . Arthritis Mother        Degenerative  . Colon cancer Maternal Grandfather        < 60  . Breast cancer Sister        BRACA gene    Allergies  Allergen Reactions  . Codeine     REACTION: nausea   Current Outpatient Medications on File Prior to Visit  Medication Sig Dispense Refill  . fluticasone (FLONASE) 50 MCG/ACT nasal spray PLACE 2 SPRAYS INTO BOTH NOSTRILS DAILY. 16 g 11  . valACYclovir (VALTREX) 1000 MG tablet TAKE TWO PILLS (2 GM) EVERY 12 HOURS X 1 DAY AT ONSET OF SYMPTOMS 12 tablet 3   No current facility-administered medications on file prior to visit.     Review of Systems  Constitutional: Negative for activity change, appetite change, fatigue, fever and unexpected weight change.  HENT: Negative for congestion, ear pain, rhinorrhea, sinus pressure and sore throat.  Eyes: Negative for pain, redness and visual disturbance.  Respiratory: Negative for cough, shortness of breath and wheezing.   Cardiovascular: Negative for chest pain and palpitations.  Gastrointestinal:  Negative for abdominal pain, blood in stool, constipation and diarrhea.  Endocrine: Negative for polydipsia and polyuria.  Genitourinary: Negative for dysuria, frequency and urgency.  Musculoskeletal: Positive for arthralgias. Negative for back pain and myalgias.       R knee pain and swelling  Skin: Negative for pallor and rash.  Allergic/Immunologic: Negative for environmental allergies.  Neurological: Negative for dizziness, syncope and headaches.  Hematological: Negative for adenopathy. Does not bruise/bleed easily.  Psychiatric/Behavioral: Negative for decreased concentration and dysphoric mood. The patient is not nervous/anxious.        Objective:   Physical Exam  Constitutional: She appears well-developed and well-nourished. No distress.  Well appearing   HENT:  Head: Normocephalic and atraumatic.  Right Ear: External ear normal.  Left Ear: External ear normal.  Mouth/Throat: Oropharynx is clear and moist.  Eyes: Pupils are equal, round, and reactive to light. Conjunctivae and EOM are normal. No scleral icterus.  Neck: Normal range of motion. Neck supple. No JVD present. Carotid bruit is not present. No thyromegaly present.  Cardiovascular: Normal rate, regular rhythm, normal heart sounds and intact distal pulses. Exam reveals no gallop.  Pulmonary/Chest: Effort normal and breath sounds normal. No respiratory distress. She has no wheezes. She exhibits no tenderness. No breast tenderness, discharge or bleeding.  Abdominal: Soft. Bowel sounds are normal. She exhibits no distension, no abdominal bruit and no mass. There is no tenderness.  Genitourinary: No breast tenderness, discharge or bleeding.  Genitourinary Comments: Breast exam: No mass, nodules, thickening, tenderness, bulging, retraction, inflamation, nipple discharge or skin changes noted.  No axillary or clavicular LA.    Dense tissue diffusely  Musculoskeletal: Normal range of motion. She exhibits no edema or tenderness.    Lymphadenopathy:    She has no cervical adenopathy.  Neurological: She is alert. She has normal reflexes. No cranial nerve deficit. She exhibits normal muscle tone. Coordination normal.  Skin: Skin is warm and dry. No rash noted. No erythema. No pallor.  Solar lentigines diffusely   Psychiatric: She has a normal mood and affect.  Pleasant Good mood          Assessment & Plan:   Problem List Items Addressed This Visit      Other   Routine general medical examination at a health care facility - Primary    Reviewed health habits including diet and exercise and skin cancer prevention Reviewed appropriate screening tests for age  Also reviewed health mt list, fam hx and immunization status , as well as social and family history   Labs reviewed  Fairly good cholesterol profile and health habits  Urged to get her mammogram scheduled for routine surveillance (with strong family hx)

## 2017-11-05 NOTE — Patient Instructions (Addendum)
Call and make your mammogram appt when you get home   If you need a referral let us know   Keep up the good work   We will make you an appt with Dr Patsy Lageropland ([facility]) for your knee   Eat healthy / keep exercising

## 2017-11-05 NOTE — Assessment & Plan Note (Signed)
Reviewed health habits including diet and exercise and skin cancer prevention Reviewed appropriate screening tests for age  Also reviewed health mt list, fam hx and immunization status , as well as social and family history   Labs reviewed  Fairly good cholesterol profile and health habits  Urged to get her mammogram scheduled for routine surveillance (with strong family hx)

## 2017-11-18 ENCOUNTER — Ambulatory Visit: Payer: BC Managed Care – PPO | Admitting: Family Medicine

## 2017-12-16 ENCOUNTER — Ambulatory Visit: Payer: BC Managed Care – PPO | Admitting: Internal Medicine

## 2017-12-16 ENCOUNTER — Encounter: Payer: Self-pay | Admitting: Internal Medicine

## 2017-12-16 ENCOUNTER — Other Ambulatory Visit: Payer: Self-pay | Admitting: Internal Medicine

## 2017-12-16 VITALS — BP 122/80 | HR 97 | Temp 98.5°F | Wt 180.0 lb

## 2017-12-16 DIAGNOSIS — B309 Viral conjunctivitis, unspecified: Secondary | ICD-10-CM

## 2017-12-16 MED ORDER — AZITHROMYCIN 1 % OP SOLN: 1.0000 [drp] | Freq: Every day | OPHTHALMIC | 0 refills | Status: DC

## 2017-12-16 NOTE — Progress Notes (Signed)
Subjective:    Patient ID: Margaret HasteElizabeth C Giuffre, female    DOB: [birth year], 42 y.o.   MRN: [id number]  HPI  Pt presents to the clinic today with c/o left eye irritation and drainage. She reports she woke up this am with her eye crusted shut. She feels pressure in her eye but denies visual changes. She denies runny nose, nasal congestion, ear pain or sore throat. She denies fever, chills or body aches. She has tried Visine Allergy with minimal relief.  Review of Systems  Past Medical History:  Diagnosis Date  . Anemia   . Depressive disorder, not elsewhere classified   . Esophageal reflux   . HPV-genital warts   . Pure hypercholesterolemia     Current Outpatient Medications  Medication Sig Dispense Refill  . fluticasone (FLONASE) 50 MCG/ACT nasal spray PLACE 2 SPRAYS INTO BOTH NOSTRILS DAILY. 16 g 11  . valACYclovir (VALTREX) 1000 MG tablet TAKE TWO PILLS (2 GM) EVERY 12 HOURS X 1 DAY AT ONSET OF SYMPTOMS 12 tablet 3  . azithromycin (AZASITE) 1 % ophthalmic solution Place 1 drop into the left eye daily. 2.5 mL 0   No current facility-administered medications for this visit.     Allergies  Allergen Reactions  . Codeine     REACTION: nausea    Family History  Problem Relation Age of Onset  . Alcohol abuse Father   . Diabetes Father   . Hyperlipidemia Father   . Hypertension Father   . Arthritis Unknown        parent  . Breast cancer Mother        < 50  . Depression Mother   . Arthritis Mother        Degenerative  . Colon cancer Maternal Grandfather        < 60  . Breast cancer Sister        Evelena PeatBRACA gene     Social History   Socioeconomic History  . Marital status: Married    Spouse name: Not on file  . Number of children: 2  . Years of education: Not on file  . Highest education level: Not on file  Occupational History  . Occupation: Futures traderHomemaker  Social Needs  . Financial resource strain: Not on file  . Food insecurity:    Worry: Not on file    Inability: Not  on file  . Transportation needs:    Medical: Not on file    Non-medical: Not on file  Tobacco Use  . Smoking status: Never Smoker  . Smokeless tobacco: Never Used  Substance and Sexual Activity  . Alcohol use: Yes    Alcohol/week: 0.0 oz    Comment: occasional  . Drug use: No  . Sexual activity: Not on file  Lifestyle  . Physical activity:    Days per week: Not on file    Minutes per session: Not on file  . Stress: Not on file  Relationships  . Social connections:    Talks on phone: Not on file    Gets together: Not on file    Attends religious service: Not on file    Active member of club or organization: Not on file    Attends meetings of clubs or organizations: Not on file    Relationship status: Not on file  . Intimate partner violence:    Fear of current or ex partner: Not on file    Emotionally abused: Not on file    Physically abused: Not  on file    Forced sexual activity: Not on file  Other Topics Concern  . Not on file  Social History Narrative   Homemaker      G2P2      No regular exercise           Constitutional: Denies fever, malaise, fatigue, headache or abrupt weight changes.  HEENT: Pt reports left eye irritation. Denies eye pain, eye redness, ear pain, ringing in the ears, wax buildup, runny nose, nasal congestion, bloody nose, or sore throat. Respiratory: Denies difficulty breathing, shortness of breath, cough or sputum production.     No other specific complaints in a complete review of systems (except as listed in HPI above).     Objective:   Physical Exam  BP 122/80   Pulse 97   Temp 98.5 F (36.9 C) (Oral)   Wt 180 lb (81.6 kg)   SpO2 98%   BMI 26.39 kg/m  Wt Readings from Last 3 Encounters:  12/16/17 180 lb (81.6 kg)  11/05/17 178 lb 8 oz (81 kg)  11/03/16 179 lb 4 oz (81.3 kg)    General: Appears her stated age, well developed, well nourished in NAD. HEENT: Head: normal shape and size; Left Eye: sclera mildly injected, no  icterus, conjunctiva mildly erythematous, PERRLA and EOMs intact;  Neck:  No adenopathy noted.   BMET    Component Value Date/Time   NA 136 11/02/2017 0728   K 4.6 11/02/2017 0728   CL 103 11/02/2017 0728   CO2 27 11/02/2017 0728   GLUCOSE 91 11/02/2017 0728   BUN 14 11/02/2017 0728   CREATININE 0.79 11/02/2017 0728   CALCIUM 9.5 11/02/2017 0728   GFRNONAA 94.75 03/13/2010 0914    Lipid Panel     Component Value Date/Time   CHOL 187 11/02/2017 0728   TRIG 141.0 11/02/2017 0728   HDL 64.90 11/02/2017 0728   CHOLHDL 3 11/02/2017 0728   VLDL 28.2 11/02/2017 0728   LDLCALC 94 11/02/2017 0728    CBC    Component Value Date/Time   WBC 5.9 11/02/2017 0728   RBC 4.63 11/02/2017 0728   HGB 14.9 11/02/2017 0728   HCT 43.2 11/02/2017 0728   PLT 263.0 11/02/2017 0728   MCV 93.4 11/02/2017 0728   MCHC 34.6 11/02/2017 0728   RDW 12.5 11/02/2017 0728   LYMPHSABS 2.0 11/02/2017 0728   MONOABS 0.5 11/02/2017 0728   EOSABS 0.1 11/02/2017 0728   BASOSABS 0.0 11/02/2017 0728    Hgb A1C No results found for: HGBA1C          Assessment & Plan:   Viral Conjunctivitis, Left Eye:  Warm compresses TID Saline eye drops may be better than Visine allergy eRx for Azithromycin eye gtt daily x 5 days to start over the weekend if redness, pain, drainage is worse  Return precautions discussed Nicki Reaper, NP

## 2017-12-16 NOTE — Patient Instructions (Signed)
Viral Conjunctivitis, Adult Viral conjunctivitis is an inflammation of the clear membrane that covers the white part of your eye and the inner surface of your eyelid (conjunctiva). The inflammation is caused by a viral infection. The blood vessels in the conjunctiva become inflamed, causing the eye to become red or pink, and often itchy. Viral conjunctivitis can be easily passed from one person to another (is contagious). This condition is often called pink eye. What are the causes? This condition is caused by a virus. A virus is a type of contagious germ. It can be spread by touching objects that have been contaminated with the virus, such as doorknobs or towels. It can also be passed through droplets, such as from coughing or sneezing. What are the signs or symptoms? Symptoms of this condition include:  Eye redness.  Tearing or watery eyes.  Itchy and irritated eyes.  Burning feeling in the eyes.  Clear drainage from the eye.  Swollen eyelids.  A gritty feeling in the eye.  Light sensitivity.  This condition often occurs with other symptoms, such as a fever, nausea, or a rash. How is this diagnosed? This condition is diagnosed with a medical history and physical exam. If you have discharge from your eye, the discharge may be tested to rule out other causes of conjunctivitis. How is this treated? Viral conjunctivitis does not respond to medicines that kill bacteria (antibiotics). Treatment for viral conjunctivitis is directed at stopping a bacterial infection from developing in addition to the viral infection. Treatment also aims to relieve your symptoms, such as itching. This may be done with antihistamine drops or other eye medicines. Rarely, steroid eye drops or antiviral medicines may be prescribed. Follow these instructions at home: Medicines   Take or apply over-the-counter and prescription medicines only as told by your health care provider.  Be very careful to avoid  touching the edge of the eyelid with the eye drop bottle or ointment tube when applying medicines to the affected eye. Being careful this way will stop you from spreading the infection to the other eye or to other people. Eye care  Avoid touching or rubbing your eyes.  Apply a warm, wet, clean washcloth to your eye for 10-20 minutes, 3-4 times per day or as told by your health care provider.  If you wear contact lenses, do not wear them until the inflammation is gone and your health care provider says it is safe to wear them again. Ask your health care provider how to sterilize or replace your contact lenses before using them again. Wear glasses until you can resume wearing contacts.  Avoid wearing eye makeup until the inflammation is gone. Throw away any old eye cosmetics that may be contaminated.  Gently wipe away any drainage from your eye with a warm, wet washcloth or a cotton ball. General instructions  Change or wash your pillowcase every day or as told by your health care provider.  Do not share towels, pillowcases, washcloths, eye makeup, makeup brushes, contact lenses, or glasses. This may spread the infection.  Wash your hands often with soap and water. Use paper towels to dry your hands. If soap and water are not available, use hand sanitizer.  Try to avoid contact with other people for one week or as told by your health care provider. Contact a health care provider if:  Your symptoms do not improve with treatment or they get worse.  You have increased pain.  Your vision becomes blurry.  You have a   fever.  You have facial pain, redness, or swelling.  You have yellow or green drainage coming from your eye.  You have new symptoms. This information is not intended to replace advice given to you by your health care provider. Make sure you discuss any questions you have with your health care provider. Document Released: 08/09/2002 Document Revised: 12/15/2015 Document  Reviewed: 12/04/2015 Elsevier Interactive Patient Education  2018 [employer]  

## 2017-12-17 MED ORDER — MOXIFLOXACIN HCL 0.5 % OP SOLN: 1.0000 [drp] | Freq: Three times a day (TID) | OPHTHALMIC | 0 refills | Status: DC

## 2018-07-12 ENCOUNTER — Other Ambulatory Visit: Payer: Self-pay | Admitting: Family Medicine

## 2018-07-16 ENCOUNTER — Ambulatory Visit: Payer: BC Managed Care – PPO | Admitting: Family Medicine

## 2018-07-16 ENCOUNTER — Encounter: Payer: Self-pay | Admitting: Family Medicine

## 2018-07-16 VITALS — BP 122/82 | HR 94 | Temp 98.2°F | Resp 16 | Ht 69.0 in | Wt 181.0 lb

## 2018-07-16 DIAGNOSIS — R3915 Urgency of urination: Secondary | ICD-10-CM | POA: Diagnosis not present

## 2018-07-16 DIAGNOSIS — N3090 Cystitis, unspecified without hematuria: Secondary | ICD-10-CM | POA: Diagnosis not present

## 2018-07-16 DIAGNOSIS — H938X2 Other specified disorders of left ear: Secondary | ICD-10-CM

## 2018-07-16 LAB — POCT URINALYSIS DIPSTICK
Bilirubin, UA: NEGATIVE
Glucose, UA: NEGATIVE
Ketones, UA: 15
Nitrite, UA: NEGATIVE
Protein, UA: NEGATIVE
SPEC GRAV UA: 1.02 (ref 1.010–1.025)
Urobilinogen, UA: 0.2 E.U./dL
pH, UA: 5.5 (ref 5.0–8.0)

## 2018-07-16 NOTE — Patient Instructions (Signed)
Good to see you   Continue over the counter meds for sinuses for your ear- blocked eustachian tube  I will notify you of urine results and if an antibiotic is needed

## 2018-07-16 NOTE — Progress Notes (Signed)
Subjective:    Patient ID: Margaret [name], female    DOB: Dec 06, 1975, 43 y.o.   MRN: [id number]  HPI This is a 43 yo female who presents today with urinary urgency x 3 weeks. She denies fever, hematuria, dysuria or back pain. No vaginal discharge, itching or burning.Took some otc urine symptom relief medication with some improvement then stopped it and felt that symptoms returned. She is a Programme researcher, broadcasting/film/video. Good water intake. She usually urinates 2x/ day while working and has had to urinate more frequently, also waking up around 3 am to urinate.  Left ear pressure x 1 week. Occasionally feels clogged. No headache, nasal drainage/congestions, sore throat, cough. Taking Advil cold and sinus with good relief.   Past Medical History:  Diagnosis Date  . Anemia   . Depressive disorder, not elsewhere classified   . Esophageal reflux   . HPV-genital warts   . Pure hypercholesterolemia    Past Surgical History:  Procedure Laterality Date  . chin implant    . COLONOSCOPY  11/19/10   normal  . FLEXIBLE SIGMOIDOSCOPY  11/19/10   normal  . RHINOPLASTY    . TONSILLECTOMY  1985   Family History  Problem Relation Age of Onset  . Alcohol abuse Father   . Diabetes Father   . Hyperlipidemia Father   . Hypertension Father   . Arthritis Unknown        parent  . Breast cancer Mother        < 50  . Depression Mother   . Arthritis Mother        Degenerative  . Colon cancer Maternal Grandfather        < 60  . Breast cancer Sister        Evelena Peat gene    Social History   Tobacco Use  . Smoking status: Never Smoker  . Smokeless tobacco: Never Used  Substance Use Topics  . Alcohol use: Yes    Alcohol/week: 0.0 standard drinks    Comment: occasional  . Drug use: No      Review of Systems Per HPI    Objective:   Physical Exam Vitals signs reviewed.  Constitutional:      General: She is not in acute distress.    Appearance: Normal appearance. She is normal weight. She  is not ill-appearing or toxic-appearing.  HENT:     Head: Normocephalic and atraumatic.     Right Ear: Tympanic membrane, ear canal and external ear normal.     Left Ear: Tympanic membrane, ear canal and external ear normal.     Nose: Nose normal.     Mouth/Throat:     Mouth: Mucous membranes are moist.     Pharynx: Oropharynx is clear.  Eyes:     Conjunctiva/sclera: Conjunctivae normal.  Neck:     Musculoskeletal: Normal range of motion and neck supple. No neck rigidity or muscular tenderness.  Cardiovascular:     Rate and Rhythm: Normal rate and regular rhythm.     Heart sounds: Normal heart sounds.  Pulmonary:     Effort: Pulmonary effort is normal.     Breath sounds: Normal breath sounds.  Abdominal:     General: There is no distension.     Palpations: Abdomen is soft.     Tenderness: There is no abdominal tenderness. There is no right CVA tenderness, left CVA tenderness, guarding or rebound.  Lymphadenopathy:     Cervical: No cervical adenopathy.  Skin:  General: Skin is warm and dry.  Neurological:     Mental Status: She is alert and oriented to person, place, and time.  Psychiatric:        Mood and Affect: Mood normal.        Behavior: Behavior normal.        Thought Content: Thought content normal.        Judgment: Judgment normal.       BP 122/82 (BP Location: Right Arm, Patient Position: Sitting, Cuff Size: Normal)   Pulse 94   Temp 98.2 F (36.8 C) (Oral)   Resp 16   Ht 5\' 9"  (1.753 m)   Wt 181 lb (82.1 kg)   LMP 07/07/2018   SpO2 97%   BMI 26.73 kg/m      Results for orders placed or performed in visit on 07/16/18  POCT urinalysis dipstick  Result Value Ref Range   Color, UA yellow    Clarity, UA clear    Glucose, UA Negative Negative   Bilirubin, UA neg    Ketones, UA 15 mg    Spec Grav, UA 1.020 1.010 - 1.025   Blood, UA 1+    pH, UA 5.5 5.0 - 8.0   Protein, UA Negative Negative   Urobilinogen, UA 0.2 0.2 or 1.0 E.U./dL   Nitrite, UA  neg    Leukocytes, UA Trace (A) Negative   Appearance     Odor      Assessment & Plan:  1. Urinary urgency - POCT urinalysis dipstick  2. Cystitis - discussed urinalysis results with patient, she wants to wait for urine culture results prior to starting medication - encouraged her to increase fluids and void more frequently over the weekend - Urine Culture  3. Ear pressure, left - no worrisome findings on exam, likely mildly blocked eustachian tube - continue otc meds prn - follow up if worsening symptoms   Olean Ree, FNP-BC  [facility], [facility]  07/16/2018 4:47 PM

## 2018-07-17 LAB — URINE CULTURE
MICRO NUMBER: 197369
SPECIMEN QUALITY:: ADEQUATE

## 2018-10-27 ENCOUNTER — Telehealth: Payer: Self-pay

## 2018-10-27 NOTE — Telephone Encounter (Signed)
Will forward to [facility] to contact patient and assist with rescheduling.

## 2018-10-27 NOTE — Telephone Encounter (Signed)
PLEASE NOTE: All timestamps contained within this report are represented as [place] Standard Time. CONFIDENTIALTY NOTICE: This fax transmission is intended only for the addressee. It contains information that is legally privileged, confidential or otherwise protected from use or disclosure. If you are not the intended recipient, you are strictly prohibited from reviewing, disclosing, copying using or disseminating any of this information or taking any action in reliance on or regarding this information. If you have received this fax in error, please notify us immediately by telephone so that we can arrange for its return to [place]. Phone: [phone], Toll-Free: [phone], Fax: [phone] Page: 1 of 1 Call Id: [id number] [facility] Night - Client Nonclinical Telephone Record [facility] Client [facility] Night - Client Client Site [facility] [place] - Night Physician Milinda Antis, Idamae Schuller - MD Contact Type Call Who Is Calling Patient / Member / Family / Caregiver Caller Name Margaret [name] Phone Number [phone] Patient Name Margaret [name] Patient DOB [birth year] Call Type Message Only Information Provided Reason for Call Request to Reschedule Office Appointment Initial Comment Caller stated she received a message about an appt and she is needing to reschedule. Additional Comment declined triage. Call Closed By: Thereasa Distance Transaction Date/Time: 10/26/2018 6:29:15 PM (ET)

## 2018-10-28 ENCOUNTER — Telehealth: Payer: Self-pay | Admitting: Family Medicine

## 2018-10-28 DIAGNOSIS — Z0000 Encounter for general adult medical examination without abnormal findings: Secondary | ICD-10-CM

## 2018-10-28 NOTE — Telephone Encounter (Signed)
-----   Message from Aquilla Solian, RT sent at 10/22/2018  1:27 PM EDT ----- Regarding: Lab orders for Friday 5.29.2020 Please place lab orders for Friday 5.29.2020, physical on Wednesday 6.3.2020 Thank you, Jones Bales RT(R)

## 2018-10-29 ENCOUNTER — Other Ambulatory Visit: Payer: BC Managed Care – PPO

## 2018-11-03 ENCOUNTER — Encounter: Payer: BC Managed Care – PPO | Admitting: Family Medicine

## 2018-11-18 ENCOUNTER — Encounter: Payer: Self-pay | Admitting: Family Medicine

## 2018-11-18 ENCOUNTER — Other Ambulatory Visit: Payer: Self-pay

## 2018-11-18 ENCOUNTER — Ambulatory Visit (INDEPENDENT_AMBULATORY_CARE_PROVIDER_SITE_OTHER): Payer: BC Managed Care – PPO | Admitting: Family Medicine

## 2018-11-18 DIAGNOSIS — B029 Zoster without complications: Secondary | ICD-10-CM | POA: Diagnosis not present

## 2018-11-18 DIAGNOSIS — Z8619 Personal history of other infectious and parasitic diseases: Secondary | ICD-10-CM | POA: Insufficient documentation

## 2018-11-18 MED ORDER — VALACYCLOVIR HCL 1 G PO TABS: 1000.0000 mg | ORAL_TABLET | Freq: Two times a day (BID) | ORAL | 0 refills | Status: DC

## 2018-11-18 NOTE — Progress Notes (Signed)
Subjective:    Patient ID: Margaret [name], female    DOB: 1975-07-10, 43 y.o.   MRN: [id number]  HPI Here with ? Shingles Painful skin area in L shoulder area   Started as a strange pain in her L shoulder Now skin on her back hurts to the touch  Pain goes under L breast   Pain -Monday Rash? When it started   Not taking any pain medication  No fever Feels ok   Patient Active Problem List   Diagnosis Date Noted  . Shingles 11/18/2018  . Screening mammogram, encounter for 11/03/2016  . Encounter for gynecological examination (general) (routine) without abnormal findings 11/03/2016  . Allergic rhinitis 09/19/2016  . History of cold sores 05/30/2016  . Family history of breast cancer 05/30/2015  . Pre-employment examination 12/12/2013  . Encounter for routine gynecological examination 04/16/2011  . Routine general medical examination at a health care facility 04/08/2011  . History of depression 12/05/2008  . GERD 12/05/2008   Past Medical History:  Diagnosis Date  . Anemia   . Depressive disorder, not elsewhere classified   . Esophageal reflux   . HPV-genital warts   . Pure hypercholesterolemia    Past Surgical History:  Procedure Laterality Date  . chin implant    . COLONOSCOPY  11/19/10   normal  . FLEXIBLE SIGMOIDOSCOPY  11/19/10   normal  . RHINOPLASTY    . TONSILLECTOMY  1985   Social History   Tobacco Use  . Smoking status: Never Smoker  . Smokeless tobacco: Never Used  Substance Use Topics  . Alcohol use: Yes    Alcohol/week: 0.0 standard drinks    Comment: occasional  . Drug use: No   Family History  Problem Relation Age of Onset  . Alcohol abuse Father   . Diabetes Father   . Hyperlipidemia Father   . Hypertension Father   . Arthritis Unknown        parent  . Breast cancer Mother        < 50  . Depression Mother   . Arthritis Mother        Degenerative  . Colon cancer Maternal Grandfather        < 60  . Breast cancer Sister    BRACA gene    Allergies  Allergen Reactions  . Codeine     REACTION: nausea   Current Outpatient Medications on File Prior to Visit  Medication Sig Dispense Refill  . fluticasone (FLONASE) 50 MCG/ACT nasal spray PLACE 2 SPRAYS INTO BOTH NOSTRILS DAILY. 16 g 11  . Multiple Vitamin (MULTIVITAMIN) capsule Take 1 capsule by mouth daily.    . valACYclovir (VALTREX) 1000 MG tablet TAKE TWO PILLS (2 GM) EVERY 12 HOURS FOR 1 DAY AT ONSET OF SYMPTOMS 12 tablet 3   No current facility-administered medications on file prior to visit.     Review of Systems  Constitutional: Negative for activity change, appetite change, fatigue, fever and unexpected weight change.  HENT: Negative for congestion, ear pain, rhinorrhea, sinus pressure and sore throat.   Eyes: Negative for pain, redness and visual disturbance.  Respiratory: Negative for cough, shortness of breath and wheezing.   Cardiovascular: Negative for chest pain and palpitations.  Gastrointestinal: Negative for abdominal pain, blood in stool, constipation and diarrhea.  Endocrine: Negative for polydipsia and polyuria.  Genitourinary: Negative for dysuria, frequency and urgency.  Musculoskeletal: Negative for arthralgias, back pain and myalgias.  Skin: Negative for pallor and rash.  Burning pain with rash on L back and side  Allergic/Immunologic: Negative for environmental allergies.  Neurological: Negative for dizziness, syncope and headaches.  Hematological: Negative for adenopathy. Does not bruise/bleed easily.  Psychiatric/Behavioral: Negative for decreased concentration and dysphoric mood. The patient is not nervous/anxious.        Objective:   Physical Exam Constitutional:      General: She is not in acute distress.    Appearance: Normal appearance. She is normal weight. She is not ill-appearing.  HENT:     Head: Normocephalic and atraumatic.     Mouth/Throat:     Mouth: Mucous membranes are moist.     Pharynx: Oropharynx is  clear. No posterior oropharyngeal erythema.  Eyes:     General:        Right eye: No discharge.        Left eye: No discharge.     Extraocular Movements: Extraocular movements intact.     Conjunctiva/sclera: Conjunctivae normal.     Pupils: Pupils are equal, round, and reactive to light.  Cardiovascular:     Rate and Rhythm: Regular rhythm. Tachycardia present.  Pulmonary:     Effort: Pulmonary effort is normal. No respiratory distress.     Breath sounds: Normal breath sounds. No wheezing or rales.  Lymphadenopathy:     Cervical: No cervical adenopathy.  Skin:    General: Skin is dry.     Findings: Rash present.     Comments: Small area of rash on L mid back- cluster of vesicles   Neurological:     General: No focal deficit present.     Mental Status: She is alert.     Sensory: No sensory deficit.     Deep Tendon Reflexes: Reflexes normal.  Psychiatric:        Mood and Affect: Mood normal.           Assessment & Plan:   Problem List Items Addressed This Visit      Other   Shingles    Mild  Rash in small area of L mid back- but pain continues across dermatome Disc watching for more rash/possible Px valtrex 1 g tid for 7d Declined pain med/ symptoms are mild Handout given Update if not starting to improve in a week or if worsening        Relevant Medications   valACYclovir (VALTREX) 1000 MG tablet

## 2018-11-18 NOTE — Patient Instructions (Addendum)
Ibuprofen and tylenol are ok for pain  Take valtrex as directed  Try to get rest and fluids - take care of yourself  Keep it covered around people   Soap and water to keep clean     Shingles  Shingles, which is also known as herpes zoster, is an infection that causes a painful skin rash and fluid-filled blisters. It is caused by a virus. Shingles only develops in people who:  Have had chickenpox.  Have been given a medicine to protect against chickenpox (have been vaccinated). Shingles is rare in this group. What are the causes? Shingles is caused by varicella-zoster virus (VZV). This is the same virus that causes chickenpox. After a person is exposed to VZV, the virus stays in the body in an inactive (dormant) state. Shingles develops if the virus is reactivated. This can happen many years after the first (initial) exposure to VZV. It is not known what causes this virus to be reactivated. What increases the risk? People who have had chickenpox or received the chickenpox vaccine are at risk for shingles. Shingles infection is more common in people who:  Are older than age 41.  Have a weakened disease-fighting system (immune system), such as people with: ? HIV. ? AIDS. ? Cancer.  Are taking medicines that weaken the immune system, such as transplant medicines.  Are experiencing a lot of stress. What are the signs or symptoms? Early symptoms of this condition include itching, tingling, and pain in an area on your skin. Pain may be described as burning, stabbing, or throbbing. A few days or weeks after early symptoms start, a painful red rash appears. The rash is usually on one side of the body and has a band-like or belt-like pattern. The rash eventually turns into fluid-filled blisters that break open, change into scabs, and dry up in about 2-3 weeks. At any time during the infection, you may also develop:  A fever.  Chills.  A headache.  An upset stomach. How is this  diagnosed? This condition is diagnosed with a skin exam. Skin or fluid samples may be taken from the blisters before a diagnosis is made. These samples are examined under a microscope or sent to a lab for testing. How is this treated? The rash may last for several weeks. There is not a specific cure for this condition. Your health care provider will probably prescribe medicines to help you manage pain, recover more quickly, and avoid long-term problems. Medicines may include:  Antiviral drugs.  Anti-inflammatory drugs.  Pain medicines.  Anti-itching medicines (antihistamines). If the area involved is on your face, you may be referred to a specialist, such as an eye doctor (ophthalmologist) or an ear, nose, and throat (ENT) doctor (otolaryngologist) to help you avoid eye problems, chronic pain, or disability. Follow these instructions at home: Medicines  Take over-the-counter and prescription medicines only as told by your health care provider.  Apply an anti-itch cream or numbing cream to the affected area as told by your health care provider. Relieving itching and discomfort   Apply cold, wet cloths (cold compresses) to the area of the rash or blisters as told by your health care provider.  Cool baths can be soothing. Try adding baking soda or dry oatmeal to the water to reduce itching. Do not bathe in hot water. Blister and rash care  Keep your rash covered with a loose bandage (dressing). Wear loose-fitting clothing to help ease the pain of material rubbing against the rash.  Keep  your rash and blisters clean by washing the area with mild soap and cool water as told by your health care provider.  Check your rash every day for signs of infection. Check for: ? More redness, swelling, or pain. ? Fluid or blood. ? Warmth. ? Pus or a bad smell.  Do not scratch your rash or pick at your blisters. To help avoid scratching: ? Keep your fingernails clean and cut short. ? Wear gloves  or mittens while you sleep, if scratching is a problem. General instructions  Rest as told by your health care provider.  Keep all follow-up visits as told by your health care provider. This is important.  Wash your hands often with soap and water. If soap and water are not available, use hand sanitizer. Doing this lowers your chance of getting a bacterial skin infection.  Before your blisters change into scabs, your shingles infection can cause chickenpox in people who have never had it or have never been vaccinated against it. To prevent this from happening, avoid contact with other people, especially: ? Babies. ? Pregnant women. ? Children who have eczema. ? Elderly people who have transplants. ? People who have chronic illnesses, such as cancer or AIDS. Contact a health care provider if:  Your pain is not relieved with prescribed medicines.  Your pain does not get better after the rash heals.  You have signs of infection in the rash area, such as: ? More redness, swelling, or pain around the rash. ? Fluid or blood coming from the rash. ? The rash area feeling warm to the touch. ? Pus or a bad smell coming from the rash. Get help right away if:  The rash is on your face or nose.  You have facial pain, pain around your eye area, or loss of feeling on one side of your face.  You have difficulty seeing.  You have ear pain or have ringing in your ear.  You have a loss of taste.  Your condition gets worse. Summary  Shingles, which is also known as herpes zoster, is an infection that causes a painful skin rash and fluid-filled blisters.  This condition is diagnosed with a skin exam. Skin or fluid samples may be taken from the blisters and examined before the diagnosis is made.  Keep your rash covered with a loose bandage (dressing). Wear loose-fitting clothing to help ease the pain of material rubbing against the rash.  Before your blisters change into scabs, your shingles  infection can cause chickenpox in people who have never had it or have never been vaccinated against it. This information is not intended to replace advice given to you by your health care provider. Make sure you discuss any questions you have with your health care provider. Document Released: 05/19/2005 Document Revised: 01/21/2017 Document Reviewed: 01/21/2017 Elsevier Interactive Patient Education  2019 [employer].

## 2018-11-19 ENCOUNTER — Encounter: Payer: Self-pay | Admitting: Family Medicine

## 2018-11-19 ENCOUNTER — Ambulatory Visit: Payer: Self-pay | Admitting: *Deleted

## 2018-11-19 NOTE — Telephone Encounter (Signed)
Message from Sheran Luz sent at 11/19/2018 4:33 PM EDT  Summary: shingles    Patient was recently diagnosed with shingles but declined pain med at appointment. Patient states that the pain is now getting worse. She inquired if there was anything that could possibly be done over the weekend to help alleviate symptoms. Please advise.          Pt called and message left for pt to return call for additional questions. Pt advised on voicemail to seek treatment in the ED/Urgent Care for worsening pain over the weekend.

## 2018-11-21 NOTE — Assessment & Plan Note (Signed)
Mild  Rash in small area of L mid back- but pain continues across dermatome Disc watching for more rash/possible Px valtrex 1 g tid for 7d Declined pain med/ symptoms are mild Handout given Update if not starting to improve in a week or if worsening

## 2018-11-22 NOTE — Telephone Encounter (Signed)
Was she seen over the weekend?  What is her status?  Let me know.  Thanks.

## 2018-11-22 NOTE — Telephone Encounter (Signed)
Patient was not seen over the weekend and says Friday was horrible with the pain but it has actually gotten better now.  Patient appreciates the call and will let us know if things worsen.

## 2019-01-07 ENCOUNTER — Telehealth: Payer: Self-pay

## 2019-01-07 NOTE — Telephone Encounter (Signed)
Left detailed VM w COVID screen and back door lab info   

## 2019-01-11 ENCOUNTER — Other Ambulatory Visit: Payer: BC Managed Care – PPO

## 2019-01-19 ENCOUNTER — Encounter: Payer: BC Managed Care – PPO | Admitting: Family Medicine

## 2019-05-16 ENCOUNTER — Other Ambulatory Visit: Payer: Self-pay | Admitting: Family Medicine

## 2019-05-16 DIAGNOSIS — Z1231 Encounter for screening mammogram for malignant neoplasm of breast: Secondary | ICD-10-CM

## 2019-05-17 ENCOUNTER — Other Ambulatory Visit (INDEPENDENT_AMBULATORY_CARE_PROVIDER_SITE_OTHER): Payer: BC Managed Care – PPO

## 2019-05-17 ENCOUNTER — Other Ambulatory Visit: Payer: Self-pay

## 2019-05-17 DIAGNOSIS — Z0000 Encounter for general adult medical examination without abnormal findings: Secondary | ICD-10-CM | POA: Diagnosis not present

## 2019-05-17 LAB — LIPID PANEL
Cholesterol: 172 mg/dL (ref 0–200)
HDL: 60.2 mg/dL (ref >39.00)
LDL Cholesterol: 88 mg/dL (ref 0–99)
NonHDL: 111.79
Total CHOL/HDL Ratio: 3
Triglycerides: 117 mg/dL (ref 0.0–149.0)
VLDL: 23.4 mg/dL (ref 0.0–40.0)

## 2019-05-17 LAB — CBC WITH DIFFERENTIAL/PLATELET
Basophils Absolute: 0 10*3/uL (ref 0.0–0.1)
Basophils Relative: 0.4 % (ref 0.0–3.0)
Eosinophils Absolute: 0.1 10*3/uL (ref 0.0–0.7)
Eosinophils Relative: 1.7 % (ref 0.0–5.0)
HCT: 43 % (ref 36.0–46.0)
Hemoglobin: 14.8 g/dL (ref 12.0–15.0)
Lymphocytes Relative: 27.7 % (ref 12.0–46.0)
Lymphs Abs: 1.5 10*3/uL (ref 0.7–4.0)
MCHC: 34.4 g/dL (ref 30.0–36.0)
MCV: 93.7 fl (ref 78.0–100.0)
Monocytes Absolute: 0.5 10*3/uL (ref 0.1–1.0)
Monocytes Relative: 8.4 % (ref 3.0–12.0)
Neutro Abs: 3.4 10*3/uL (ref 1.4–7.7)
Neutrophils Relative %: 61.8 % (ref 43.0–77.0)
Platelets: 250 10*3/uL (ref 150.0–400.0)
RBC: 4.59 Mil/uL (ref 3.87–5.11)
RDW: 12.2 % (ref 11.5–15.5)
WBC: 5.5 10*3/uL (ref 4.0–10.5)

## 2019-05-17 LAB — COMPREHENSIVE METABOLIC PANEL
ALT: 10 U/L (ref 0–35)
AST: 13 U/L (ref 0–37)
Albumin: 4.7 g/dL (ref 3.5–5.2)
Alkaline Phosphatase: 61 U/L (ref 39–117)
BUN: 11 mg/dL (ref 6–23)
CO2: 27 mEq/L (ref 19–32)
Calcium: 9.6 mg/dL (ref 8.4–10.5)
Chloride: 103 mEq/L (ref 96–112)
Creatinine, Ser: 0.86 mg/dL (ref 0.40–1.20)
GFR: 72.4 mL/min (ref >60.00)
Glucose, Bld: 96 mg/dL (ref 70–99)
Potassium: 4.5 mEq/L (ref 3.5–5.1)
Sodium: 137 mEq/L (ref 135–145)
Total Bilirubin: 0.5 mg/dL (ref 0.2–1.2)
Total Protein: 7.1 g/dL (ref 6.0–8.3)

## 2019-05-17 LAB — TSH: TSH: 1.45 u[IU]/mL (ref 0.35–4.50)

## 2019-05-18 ENCOUNTER — Ambulatory Visit
Admission: RE | Admit: 2019-05-18 | Discharge: 2019-05-18 | Disposition: A | Discharge status: Home / Self Care | Payer: BC Managed Care – PPO | Source: Ambulatory Visit | Attending: Family Medicine | Admitting: Family Medicine

## 2019-05-18 DIAGNOSIS — Z1231 Encounter for screening mammogram for malignant neoplasm of breast: Secondary | ICD-10-CM | POA: Insufficient documentation

## 2019-05-20 ENCOUNTER — Ambulatory Visit (INDEPENDENT_AMBULATORY_CARE_PROVIDER_SITE_OTHER): Payer: BC Managed Care – PPO | Admitting: Family Medicine

## 2019-05-20 ENCOUNTER — Other Ambulatory Visit: Payer: Self-pay

## 2019-05-20 ENCOUNTER — Encounter: Payer: Self-pay | Admitting: Family Medicine

## 2019-05-20 VITALS — BP 110/80 | HR 96 | Temp 98.1°F | Ht 69.0 in | Wt 183.0 lb

## 2019-05-20 DIAGNOSIS — Z0000 Encounter for general adult medical examination without abnormal findings: Secondary | ICD-10-CM | POA: Diagnosis not present

## 2019-05-20 NOTE — Progress Notes (Signed)
Subjective:    Patient ID: Margaret HasteElizabeth C [name], female    DOB: 1975-09-07, 43 y.o.   MRN: [id number]  This visit occurred during the SARS-CoV-2 public health emergency.  Safety protocols were in place, including screening questions prior to the visit, additional usage of staff PPE, and extensive cleaning of exam room while observing appropriate contact time as indicated for disinfecting solutions.    HPI Here for health maintenance exam and to review chronic medical problems    Feeling fine and taking care of herself  Stares at a screen all day- some more headaches  Working virtually right now    [employer] Readings from Last 3 Encounters:  05/20/19 183 lb (83 kg)  11/18/18 185 lb 1 oz (83.9 kg)  07/16/18 181 lb (82.1 kg)  stable weight  27.02 kg/m   Exercising more now  Would like to get down to 170 lb  Eating healthy   Pap 6/18 -neg with neg HPV Menses -no problems  Regular but heavy the first day  Husband had vasectomy   Mammogram 12/20  Self breast exam - no lumps  Strong family h/o breast cancer  Mother had breast cancer before age 41  Also sisters  Her genetic testing was neg    Tdap 12/17 Flu vaccine 10/20   Cholesterol Lab Results  Component Value Date   CHOL 172 05/17/2019   CHOL 187 11/02/2017   CHOL 169 10/30/2016   Lab Results  Component Value Date   HDL 60.20 05/17/2019   HDL 64.90 11/02/2017   HDL 68.40 10/30/2016   Lab Results  Component Value Date   LDLCALC 88 05/17/2019   LDLCALC 94 11/02/2017   LDLCALC 84 10/30/2016   Lab Results  Component Value Date   TRIG 117.0 05/17/2019   TRIG 141.0 11/02/2017   TRIG 84.0 10/30/2016   Lab Results  Component Value Date   CHOLHDL 3 05/17/2019   CHOLHDL 3 11/02/2017   CHOLHDL 2 10/30/2016   Lab Results  Component Value Date   LDLDIRECT 139.3 03/14/2009   Is eating healthy  Red meat- not often   Other labs Results for orders placed or performed in visit on 05/17/19  TSH  Result Value Ref  Range   TSH 1.45 0.35 - 4.50 uIU/mL  Lipid panel  Result Value Ref Range   Cholesterol 172 0 - 200 mg/dL   Triglycerides 409.8117.0 0.0 - 149.0 mg/dL   HDL 11.9160.20 >47.82>39.00 mg/dL   VLDL 95.623.4 0.0 - 21.340.0 mg/dL   LDL Cholesterol 88 0 - 99 mg/dL   Total CHOL/HDL Ratio 3    NonHDL 111.79   Comprehensive metabolic panel  Result Value Ref Range   Sodium 137 135 - 145 mEq/L   Potassium 4.5 3.5 - 5.1 mEq/L   Chloride 103 96 - 112 mEq/L   CO2 27 19 - 32 mEq/L   Glucose, Bld 96 70 - 99 mg/dL   BUN 11 6 - 23 mg/dL   Creatinine, Ser 0.860.86 0.40 - 1.20 mg/dL   Total Bilirubin 0.5 0.2 - 1.2 mg/dL   Alkaline Phosphatase 61 39 - 117 U/L   AST 13 0 - 37 U/L   ALT 10 0 - 35 U/L   Total Protein 7.1 6.0 - 8.3 g/dL   Albumin 4.7 3.5 - 5.2 g/dL   GFR 57.8472.40 >69.62>60.00 mL/min   Calcium 9.6 8.4 - 10.5 mg/dL  CBC with Differential/Platelet  Result Value Ref Range   WBC 5.5 4.0 - 10.5 K/uL  RBC 4.59 3.87 - 5.11 Mil/uL   Hemoglobin 14.8 12.0 - 15.0 g/dL   HCT 47.4 25.9 - 56.3 %   MCV 93.7 78.0 - 100.0 fl   MCHC 34.4 30.0 - 36.0 g/dL   RDW 87.5 64.3 - 32.9 %   Platelets 250.0 150.0 - 400.0 K/uL   Neutrophils Relative % 61.8 43.0 - 77.0 %   Lymphocytes Relative 27.7 12.0 - 46.0 %   Monocytes Relative 8.4 3.0 - 12.0 %   Eosinophils Relative 1.7 0.0 - 5.0 %   Basophils Relative 0.4 0.0 - 3.0 %   Neutro Abs 3.4 1.4 - 7.7 K/uL   Lymphs Abs 1.5 0.7 - 4.0 K/uL   Monocytes Absolute 0.5 0.1 - 1.0 K/uL   Eosinophils Absolute 0.1 0.0 - 0.7 K/uL   Basophils Absolute 0.0 0.0 - 0.1 K/uL     Patient Active Problem List   Diagnosis Date Noted  . History of shingles 11/18/2018  . Screening mammogram, encounter for 11/03/2016  . Encounter for gynecological examination (general) (routine) without abnormal findings 11/03/2016  . Allergic rhinitis 09/19/2016  . History of cold sores 05/30/2016  . Family history of breast cancer 05/30/2015  . Pre-employment examination 12/12/2013  . Encounter for routine gynecological  examination 04/16/2011  . Routine general medical examination at a health care facility 04/08/2011  . History of depression 12/05/2008  . GERD 12/05/2008   Past Medical History:  Diagnosis Date  . Anemia   . Depressive disorder, not elsewhere classified   . Esophageal reflux   . HPV-genital warts   . Pure hypercholesterolemia    Past Surgical History:  Procedure Laterality Date  . BREAST BIOPSY Right 12/19/2016   Fibrocystic changes with calcifications, coil marker  . chin implant    . COLONOSCOPY  11/19/10   normal  . FLEXIBLE SIGMOIDOSCOPY  11/19/10   normal  . RHINOPLASTY    . TONSILLECTOMY  1985   Social History   Tobacco Use  . Smoking status: Never Smoker  . Smokeless tobacco: Never Used  Substance Use Topics  . Alcohol use: Yes    Alcohol/week: 0.0 standard drinks    Comment: occasional  . Drug use: No   Family History  Problem Relation Age of Onset  . Alcohol abuse Father   . Diabetes Father   . Hyperlipidemia Father   . Hypertension Father   . Arthritis Other        parent  . Breast cancer Mother        < 50  . Depression Mother   . Arthritis Mother        Degenerative  . Colon cancer Maternal Grandfather        < 60  . Breast cancer Sister        BRACA gene    Allergies  Allergen Reactions  . Codeine     REACTION: nausea   Current Outpatient Medications on File Prior to Visit  Medication Sig Dispense Refill  . fluticasone (FLONASE) 50 MCG/ACT nasal spray PLACE 2 SPRAYS INTO BOTH NOSTRILS DAILY. 16 g 11  . Multiple Vitamin (MULTIVITAMIN) capsule Take 1 capsule by mouth daily.    . valACYclovir (VALTREX) 1000 MG tablet TAKE TWO PILLS (2 GM) EVERY 12 HOURS FOR 1 DAY AT ONSET OF SYMPTOMS 12 tablet 3   No current facility-administered medications on file prior to visit.    Review of Systems  Constitutional: Negative for activity change, appetite change, fatigue, fever and unexpected weight change.  HENT:  Negative for congestion, ear pain,  rhinorrhea, sinus pressure and sore throat.   Eyes: Negative for pain, redness and visual disturbance.  Respiratory: Negative for cough, shortness of breath and wheezing.   Cardiovascular: Negative for chest pain and palpitations.  Gastrointestinal: Negative for abdominal pain, blood in stool, constipation and diarrhea.  Endocrine: Negative for polydipsia and polyuria.  Genitourinary: Negative for dysuria, frequency and urgency.  Musculoskeletal: Negative for arthralgias, back pain and myalgias.  Skin: Negative for pallor and rash.  Allergic/Immunologic: Negative for environmental allergies.  Neurological: Negative for dizziness, syncope and headaches.  Hematological: Negative for adenopathy. Does not bruise/bleed easily.  Psychiatric/Behavioral: Negative for decreased concentration and dysphoric mood. The patient is not nervous/anxious.        Objective:   Physical Exam Constitutional:      General: She is not in acute distress.    Appearance: Normal appearance. She is well-developed and normal weight. She is not ill-appearing or diaphoretic.  HENT:     Head: Normocephalic and atraumatic.     Right Ear: Tympanic membrane, ear canal and external ear normal.     Left Ear: Tympanic membrane, ear canal and external ear normal.     Nose: Nose normal. No congestion.     Mouth/Throat:     Mouth: Mucous membranes are moist.     Pharynx: Oropharynx is clear. No posterior oropharyngeal erythema.  Eyes:     General: No scleral icterus.    Extraocular Movements: Extraocular movements intact.     Conjunctiva/sclera: Conjunctivae normal.     Pupils: Pupils are equal, round, and reactive to light.  Neck:     Thyroid: No thyromegaly.     Vascular: No carotid bruit or JVD.  Cardiovascular:     Rate and Rhythm: Normal rate and regular rhythm.     Pulses: Normal pulses.     Heart sounds: Normal heart sounds. No gallop.   Pulmonary:     Effort: Pulmonary effort is normal. No respiratory  distress.     Breath sounds: Normal breath sounds. No wheezing.     Comments: Good air exch Chest:     Chest wall: No tenderness.  Abdominal:     General: Bowel sounds are normal. There is no distension or abdominal bruit.     Palpations: Abdomen is soft. There is no mass.     Tenderness: There is no abdominal tenderness.     Hernia: No hernia is present.  Genitourinary:    Comments: Breast exam: No mass, nodules, thickening, tenderness, bulging, retraction, inflamation, nipple discharge or skin changes noted.  No axillary or clavicular LA.     Musculoskeletal:        General: No tenderness. Normal range of motion.     Cervical back: Normal range of motion and neck supple. No rigidity. No muscular tenderness.     Right lower leg: No edema.     Left lower leg: No edema.  Lymphadenopathy:     Cervical: No cervical adenopathy.  Skin:    General: Skin is warm and dry.     Coloration: Skin is not pale.     Findings: No erythema or rash.     Comments: Some lentigines and brown nevi  Neurological:     Mental Status: She is alert. Mental status is at baseline.     Cranial Nerves: No cranial nerve deficit.     Motor: No abnormal muscle tone.     Coordination: Coordination normal.     Gait: Gait normal.  Deep Tendon Reflexes: Reflexes are normal and symmetric. Reflexes normal.  Psychiatric:        Mood and Affect: Mood normal.        Cognition and Memory: Cognition and memory normal.     Comments: Pleasant  Cheerful            Assessment & Plan:   Problem List Items Addressed This Visit      Other   Routine general medical examination at a health care facility - Primary    Reviewed health habits including diet and exercise and skin cancer prevention Reviewed appropriate screening tests for age  Also reviewed health mt list, fam hx and immunization status , as well as social and family history   See HPI Labs reviewed Commended healthy diet  Encouraged refular exercise

## 2019-05-20 NOTE — Patient Instructions (Addendum)
Take care of yourself  Stay safe Labs look good   Keep exercising

## 2019-05-22 NOTE — Assessment & Plan Note (Signed)
Reviewed health habits including diet and exercise and skin cancer prevention Reviewed appropriate screening tests for age  Also reviewed health mt list, fam hx and immunization status , as well as social and family history   See HPI Labs reviewed Commended healthy diet  Encouraged refular exercise

## 2019-06-02 ENCOUNTER — Telehealth: Payer: Self-pay | Admitting: Family Medicine

## 2019-06-02 NOTE — Telephone Encounter (Signed)
Pt's husband tested positive for Covid on 05/26/19 -- test taken on 05/24/19 Husband was having symptoms starting on 05/23/2019.  He quarantined from the patient as much as he was able and now she is having symptoms.   Symptom onset 05/27/2019 Pt c/o headache, chest tightness, congestion and nausea. Denies SOB, cough and sore throat. Pt started running a fever 101 last night . No fever present this morning.   Pt is wanting to know if she should go get tested at this point.   Please advise, thanks.

## 2019-06-02 NOTE — Telephone Encounter (Signed)
I think she should get tested.   Please give her info to schedule.  Given the holiday-may not be able to get a test until Monday.  (she may want to check with a pharmacy or health dept also) If worse-go to urgent care (they would see her as a patient and then likely test her as well)  Treat symptoms, rest and isolate

## 2019-06-02 NOTE — Telephone Encounter (Signed)
Pt notified of Dr. Marliss Coots comments and instructions and testing site info given to pt, also advise of [facility] and health d[facility]., pt will go to UC if sxs worsens

## 2019-07-26 ENCOUNTER — Other Ambulatory Visit: Payer: Self-pay | Admitting: Family Medicine

## 2020-02-08 ENCOUNTER — Telehealth: Payer: Self-pay | Admitting: *Deleted

## 2020-02-08 NOTE — Telephone Encounter (Signed)
Patient called stating that she had a fever last night of 101.0 and some stomach pain in the middle. Patient stated that she took some Ibuprofen and the pain got better. Patient stated today she does not have a fever and the stomach pain now is coming back. Patient denies a fever today or diarrhea. Patient stated that she has had an ovarian cyst previously and the pain feels similar to that. Patient stated that she stayed home from work today. Patient stated that she had covid back last year and finished her covid vaccines in August. After speaking to Dr. Milinda Antis patient was advised that she needs a face to face evaluation and possibly be tested for covid. Patient was given the information on the [facility]. Patient is leaving now to head over to the UC.Marland Kitchen

## 2020-02-08 NOTE — Telephone Encounter (Signed)
Will watch for correspondence 

## 2020-02-09 ENCOUNTER — Other Ambulatory Visit: Payer: Self-pay

## 2020-02-09 ENCOUNTER — Ambulatory Visit
Admission: EM | Admit: 2020-02-09 | Discharge: 2020-02-09 | Disposition: A | Discharge status: Home / Self Care | Payer: BC Managed Care – PPO | Attending: Family Medicine | Admitting: Family Medicine

## 2020-02-09 DIAGNOSIS — R509 Fever, unspecified: Secondary | ICD-10-CM | POA: Diagnosis not present

## 2020-02-09 DIAGNOSIS — Z1152 Encounter for screening for COVID-19: Secondary | ICD-10-CM

## 2020-02-09 DIAGNOSIS — R1084 Generalized abdominal pain: Secondary | ICD-10-CM | POA: Diagnosis not present

## 2020-02-09 NOTE — ED Provider Notes (Signed)
[facility]   [id number] 02/09/20 Arrival Time: 0836  CC: ABDOMINAL PAIN  SUBJECTIVE:  Margaret [name] is a 44 y.o. female who presents with complaint of abdominal discomfort that began abruptly 2 days ago. Reports that hse also had fever of 101 x 2 days ago.  Reports that she took ibuprofen for the fever and the pain, with some relief.  Reports that today she is still feeling mild pain in left lower quadrant.  Reports that she does have a history of ovarian cyst, and is concerned that this may be the issue.  Denies a precipitating event, trauma, close contacts with similar symptoms, recent travel or antibiotic use.  Reports that fever has resolved.  Denies alleviating or aggravating factors. Denies similar symptoms in the past. Last BM x2 days ago.  Denies  chills, appetite changes, weight changes, nausea, vomiting, chest pain, SOB, diarrhea, constipation, hematochezia, melena, dysuria, difficulty urinating, increased frequency or urgency, flank pain, loss of bowel or bladder function, vaginal discharge, vaginal odor, vaginal bleeding, dyspareunia, pelvic pain.     Patient's last menstrual period was 01/31/2020 (exact date).  ROS: As per HPI.  All other pertinent ROS negative.     Past Medical History:  Diagnosis Date  . Anemia   . Depressive disorder, not elsewhere classified   . Esophageal reflux   . HPV-genital warts   . Pure hypercholesterolemia    Past Surgical History:  Procedure Laterality Date  . BREAST BIOPSY Right 12/19/2016   Fibrocystic changes with calcifications, coil marker  . chin implant    . COLONOSCOPY  11/19/10   normal  . FLEXIBLE SIGMOIDOSCOPY  11/19/10   normal  . RHINOPLASTY    . TONSILLECTOMY  1985   Allergies  Allergen Reactions  . Codeine     REACTION: nausea   No current facility-administered medications on file prior to encounter.   Current Outpatient Medications on File Prior to Encounter  Medication Sig Dispense Refill  .  fluticasone (FLONASE) 50 MCG/ACT nasal spray PLACE 2 SPRAYS INTO BOTH NOSTRILS DAILY. 16 g 11  . Multiple Vitamin (MULTIVITAMIN) capsule Take 1 capsule by mouth daily.    . valACYclovir (VALTREX) 1000 MG tablet TAKE TWO PILLS EVERY 12 HOURS FOR 1 DAY AT ONSET OF SYMPTOMS 12 tablet 1   Social History   Socioeconomic History  . Marital status: Married    Spouse name: Not on file  . Number of children: 2  . Years of education: Not on file  . Highest education level: Not on file  Occupational History  . Occupation: Homemaker  Tobacco Use  . Smoking status: Never Smoker  . Smokeless tobacco: Never Used  Substance and Sexual Activity  . Alcohol use: Yes    Alcohol/week: 0.0 standard drinks    Comment: occasional  . Drug use: No  . Sexual activity: Not on file  Other Topics Concern  . Not on file  Social History Narrative   Homemaker      G2P2      No regular exercise         Social Determinants of Health   Financial Resource Strain:   . Difficulty of Paying Living Expenses: Not on file  Food Insecurity:   . Worried About Programme researcher, broadcasting/film/video in the Last Year: Not on file  . Ran Out of Food in the Last Year: Not on file  Transportation Needs:   . Lack of Transportation (Medical): Not on file  . Lack of Transportation (Non-Medical):  Not on file  Physical Activity:   . Days of Exercise per Week: Not on file  . Minutes of Exercise per Session: Not on file  Stress:   . Feeling of Stress : Not on file  Social Connections:   . Frequency of Communication with Friends and Family: Not on file  . Frequency of Social Gatherings with Friends and Family: Not on file  . Attends Religious Services: Not on file  . Active Member of Clubs or Organizations: Not on file  . Attends Banker Meetings: Not on file  . Marital Status: Not on file  Intimate Partner Violence:   . Fear of Current or Ex-Partner: Not on file  . Emotionally Abused: Not on file  . Physically Abused:  Not on file  . Sexually Abused: Not on file   Family History  Problem Relation Age of Onset  . Alcohol abuse Father   . Diabetes Father   . Hyperlipidemia Father   . Hypertension Father   . Arthritis Other        parent  . Breast cancer Mother        < 50  . Depression Mother   . Arthritis Mother        Degenerative  . Colon cancer Maternal Grandfather        < 60  . Breast cancer Sister        BRACA gene      OBJECTIVE:  Vitals:   02/09/20 0846  BP: (!) 133/94  Pulse: 92  Resp: 18  Temp: 98.9 F (37.2 C)  TempSrc: Oral  SpO2: 97%    General appearance: Alert; NAD HEENT: NCAT.  Oropharynx clear.  Lungs: clear to auscultation bilaterally without adventitious breath sounds Heart: regular rate and rhythm.  Radial pulses 2+ symmetrical bilaterally Abdomen: soft, non-distended; normal active bowel sounds; mild tenderness to left lower quadrant with deep palpation; nontender at McBurney's point; negative Murphy's sign; negative rebound; no guarding Back: no CVA tenderness Extremities: no edema; symmetrical with no gross deformities Skin: warm and dry Neurologic: normal gait Psychological: alert and cooperative; normal mood and affect  LABS: No results found for this or any previous visit (from the past 24 hour(s)).  DIAGNOSTIC STUDIES: No results found.   ASSESSMENT & PLAN:  1. Generalized abdominal pain   2. Encounter for screening for COVID-19   3. Fever, unspecified fever cause     Possible ovarian cyst vs constipation Recommended miralax Also possible ultrasound to rule out ovarian cyst  If you experience new or worsening symptoms return or go to ER such as fever, chills, nausea, vomiting, diarrhea, bloody or dark tarry stools, constipation, urinary symptoms, worsening abdominal discomfort, symptoms that do not improve with medications, inability to keep fluids down.  Reviewed expectations re: course of current medical issues. Questions  answered. Outlined signs and symptoms indicating need for more acute intervention. Patient verbalized understanding. After Visit Summary given.   Moshe Cipro, NP 02/09/20 [phone]

## 2020-02-09 NOTE — Discharge Instructions (Signed)
Your COVID test is pending.  You should self quarantine until the test result is back.    Take Tylenol as needed for fever or discomfort.  Rest and keep yourself hydrated.    Go to the emergency department if you develop acute worsening symptoms.     

## 2020-02-09 NOTE — ED Triage Notes (Signed)
Pt reports severe, cramping upper abdominal pain two days ago accompanied by fever.  Throughout that day the pain migrated to lower abd.  No diarrhea or vomiting.  Abd pain and fever only lasted that day.  PCP will not see her bc there was a fever.  Employer also requires COVID test to be cleared for work.  No cough, body aches, SOB.   COVID vaccine #2 08/05.

## 2020-02-11 LAB — NOVEL CORONAVIRUS, NAA: SARS-CoV-2, NAA: NOT DETECTED

## 2020-02-11 LAB — SARS-COV-2, NAA 2 DAY TAT

## 2020-04-16 ENCOUNTER — Other Ambulatory Visit: Payer: BC Managed Care – PPO

## 2020-04-22 ENCOUNTER — Telehealth: Payer: Self-pay | Admitting: Family Medicine

## 2020-04-22 DIAGNOSIS — Z0000 Encounter for general adult medical examination without abnormal findings: Secondary | ICD-10-CM

## 2020-04-22 NOTE — Telephone Encounter (Signed)
-----   Message from Aquilla Solian, RT sent at 04/16/2020  2:11 PM EST ----- Regarding: Lab Orders for Monday 11.22.2021 Please place lab orders for Monday 11.22.2021, office visit for physical on Thursday 12.2.2021 Thank you, Jones Bales RT(R)

## 2020-04-23 ENCOUNTER — Other Ambulatory Visit: Payer: BC Managed Care – PPO

## 2020-04-24 ENCOUNTER — Other Ambulatory Visit: Payer: Self-pay | Admitting: Family Medicine

## 2020-04-24 DIAGNOSIS — Z1231 Encounter for screening mammogram for malignant neoplasm of breast: Secondary | ICD-10-CM

## 2020-04-25 ENCOUNTER — Encounter: Payer: BC Managed Care – PPO | Admitting: Family Medicine

## 2020-05-03 ENCOUNTER — Encounter: Payer: BC Managed Care – PPO | Admitting: Family Medicine

## 2020-05-21 ENCOUNTER — Other Ambulatory Visit: Payer: Self-pay

## 2020-05-21 ENCOUNTER — Other Ambulatory Visit (INDEPENDENT_AMBULATORY_CARE_PROVIDER_SITE_OTHER): Payer: BC Managed Care – PPO

## 2020-05-21 ENCOUNTER — Ambulatory Visit
Admission: RE | Admit: 2020-05-21 | Discharge: 2020-05-21 | Disposition: A | Discharge status: Home / Self Care | Payer: BC Managed Care – PPO | Source: Ambulatory Visit | Attending: Family Medicine | Admitting: Family Medicine

## 2020-05-21 DIAGNOSIS — Z1231 Encounter for screening mammogram for malignant neoplasm of breast: Secondary | ICD-10-CM | POA: Insufficient documentation

## 2020-05-21 DIAGNOSIS — Z0000 Encounter for general adult medical examination without abnormal findings: Secondary | ICD-10-CM

## 2020-05-21 LAB — CBC WITH DIFFERENTIAL/PLATELET
Basophils Absolute: 0 10*3/uL (ref 0.0–0.1)
Basophils Relative: 0.7 % (ref 0.0–3.0)
Eosinophils Absolute: 0.1 10*3/uL (ref 0.0–0.7)
Eosinophils Relative: 1.8 % (ref 0.0–5.0)
HCT: 40.7 % (ref 36.0–46.0)
Hemoglobin: 14.3 g/dL (ref 12.0–15.0)
Lymphocytes Relative: 35.1 % (ref 12.0–46.0)
Lymphs Abs: 1.6 10*3/uL (ref 0.7–4.0)
MCHC: 35.2 g/dL (ref 30.0–36.0)
MCV: 91.1 fl (ref 78.0–100.0)
Monocytes Absolute: 0.4 10*3/uL (ref 0.1–1.0)
Monocytes Relative: 9.3 % (ref 3.0–12.0)
Neutro Abs: 2.5 10*3/uL (ref 1.4–7.7)
Neutrophils Relative %: 53.1 % (ref 43.0–77.0)
Platelets: 269 10*3/uL (ref 150.0–400.0)
RBC: 4.47 Mil/uL (ref 3.87–5.11)
RDW: 12.9 % (ref 11.5–15.5)
WBC: 4.6 10*3/uL (ref 4.0–10.5)

## 2020-05-21 LAB — COMPREHENSIVE METABOLIC PANEL
ALT: 12 U/L (ref 0–35)
AST: 15 U/L (ref 0–37)
Albumin: 4.5 g/dL (ref 3.5–5.2)
Alkaline Phosphatase: 53 U/L (ref 39–117)
BUN: 14 mg/dL (ref 6–23)
CO2: 28 mEq/L (ref 19–32)
Calcium: 9.3 mg/dL (ref 8.4–10.5)
Chloride: 104 mEq/L (ref 96–112)
Creatinine, Ser: 0.76 mg/dL (ref 0.40–1.20)
GFR: 96.32 mL/min (ref >60.00)
Glucose, Bld: 87 mg/dL (ref 70–99)
Potassium: 4.5 mEq/L (ref 3.5–5.1)
Sodium: 137 mEq/L (ref 135–145)
Total Bilirubin: 0.6 mg/dL (ref 0.2–1.2)
Total Protein: 6.8 g/dL (ref 6.0–8.3)

## 2020-05-21 LAB — LIPID PANEL
Cholesterol: 183 mg/dL (ref 0–200)
HDL: 65.4 mg/dL (ref >39.00)
LDL Cholesterol: 89 mg/dL (ref 0–99)
NonHDL: 117.22
Total CHOL/HDL Ratio: 3
Triglycerides: 142 mg/dL (ref 0.0–149.0)
VLDL: 28.4 mg/dL (ref 0.0–40.0)

## 2020-05-21 LAB — TSH: TSH: 1.36 u[IU]/mL (ref 0.35–4.50)

## 2020-05-22 ENCOUNTER — Other Ambulatory Visit (HOSPITAL_COMMUNITY)
Admission: RE | Admit: 2020-05-22 | Discharge: 2020-05-22 | Disposition: A | Discharge status: Home / Self Care | Payer: BC Managed Care – PPO | Source: Ambulatory Visit | Attending: Family Medicine | Admitting: Family Medicine

## 2020-05-22 ENCOUNTER — Encounter: Payer: Self-pay | Admitting: Family Medicine

## 2020-05-22 ENCOUNTER — Ambulatory Visit (INDEPENDENT_AMBULATORY_CARE_PROVIDER_SITE_OTHER): Payer: BC Managed Care – PPO | Admitting: Family Medicine

## 2020-05-22 VITALS — BP 124/80 | HR 75 | Temp 97.2°F | Ht 69.0 in | Wt 177.5 lb

## 2020-05-22 DIAGNOSIS — Z01419 Encounter for gynecological examination (general) (routine) without abnormal findings: Secondary | ICD-10-CM | POA: Insufficient documentation

## 2020-05-22 DIAGNOSIS — Z0000 Encounter for general adult medical examination without abnormal findings: Secondary | ICD-10-CM

## 2020-05-22 DIAGNOSIS — Z23 Encounter for immunization: Secondary | ICD-10-CM | POA: Diagnosis not present

## 2020-05-22 NOTE — Patient Instructions (Signed)
Take care of yourself   Labs look good   Flu shot today   Keep walking and eating a healthy diet

## 2020-05-22 NOTE — Progress Notes (Signed)
Subjective:    Patient ID: Wyatt HasteElizabeth C Leedy, female    DOB: 14-Nov-1975, 44 y.o.   MRN: [id number]  This visit occurred during the SARS-CoV-2 public health emergency.  Safety protocols were in place, including screening questions prior to the visit, additional usage of staff PPE, and extensive cleaning of exam room while observing appropriate contact time as indicated for disinfecting solutions.    HPI Here for health maintenance exam and to review chronic medical problems    Wt Readings from Last 3 Encounters:  05/22/20 177 lb 8 oz (80.5 kg)  05/20/19 183 lb (83 kg)  11/18/18 185 lb 1 oz (83.9 kg)   26.21 kg/m   Doing well overall  Taking care of herself  Walking for exercise   Lost weight and gained some back (with holidays)  Was walking more and eating more green vegetables   Had covid vaccines   Flu shot -today  Tdap 12/17  Pap 6/18 neg with neg HPV No new sexual partners  Periods are regular  2 heavy days  Last about 6 days   No contraception  husb had a vasectomy   Had pain in L pelvis sept  Has not come back  ? If it was a cyst     Mammogram yesterday   -pending result Mother had breast cancer  Self breast exam -no changes or lumps   BP Readings from Last 3 Encounters:  05/22/20 124/80  02/09/20 (!) 133/94  05/20/19 110/80   Pulse Readings from Last 3 Encounters:  05/22/20 75  02/09/20 92  05/20/19 96     Labs  Cholesterol  Lab Results  Component Value Date   CHOL 183 05/21/2020   CHOL 172 05/17/2019   CHOL 187 11/02/2017   Lab Results  Component Value Date   HDL 65.40 05/21/2020   HDL 60.20 05/17/2019   HDL 64.90 11/02/2017   Lab Results  Component Value Date   LDLCALC 89 05/21/2020   LDLCALC 88 05/17/2019   LDLCALC 94 11/02/2017   Lab Results  Component Value Date   TRIG 142.0 05/21/2020   TRIG 117.0 05/17/2019   TRIG 141.0 11/02/2017   Lab Results  Component Value Date   CHOLHDL 3 05/21/2020   CHOLHDL 3 05/17/2019    CHOLHDL 3 11/02/2017   Lab Results  Component Value Date   LDLDIRECT 139.3 03/14/2009   Very good  Eats very healthy for the most part   Other labs Results for orders placed or performed in visit on 05/21/20  TSH  Result Value Ref Range   TSH 1.36 0.35 - 4.50 uIU/mL  Lipid panel  Result Value Ref Range   Cholesterol 183 0 - 200 mg/dL   Triglycerides 086.5142.0 0.0 - 149.0 mg/dL   HDL 78.4665.40 >96.29>39.00 mg/dL   VLDL 52.828.4 0.0 - 41.340.0 mg/dL   LDL Cholesterol 89 0 - 99 mg/dL   Total CHOL/HDL Ratio 3    NonHDL 117.22   Comprehensive metabolic panel  Result Value Ref Range   Sodium 137 135 - 145 mEq/L   Potassium 4.5 3.5 - 5.1 mEq/L   Chloride 104 96 - 112 mEq/L   CO2 28 19 - 32 mEq/L   Glucose, Bld 87 70 - 99 mg/dL   BUN 14 6 - 23 mg/dL   Creatinine, Ser 2.440.76 0.40 - 1.20 mg/dL   Total Bilirubin 0.6 0.2 - 1.2 mg/dL   Alkaline Phosphatase 53 39 - 117 U/L   AST 15 0 - 37 U/L  ALT 12 0 - 35 U/L   Total Protein 6.8 6.0 - 8.3 g/dL   Albumin 4.5 3.5 - 5.2 g/dL   GFR 78.29 >56.21 mL/min   Calcium 9.3 8.4 - 10.5 mg/dL  CBC with Differential/Platelet  Result Value Ref Range   WBC 4.6 4.0 - 10.5 K/uL   RBC 4.47 3.87 - 5.11 Mil/uL   Hemoglobin 14.3 12.0 - 15.0 g/dL   HCT 30.8 65.7 - 84.6 %   MCV 91.1 78.0 - 100.0 fl   MCHC 35.2 30.0 - 36.0 g/dL   RDW 96.2 95.2 - 84.1 %   Platelets 269.0 150.0 - 400.0 K/uL   Neutrophils Relative % 53.1 43.0 - 77.0 %   Lymphocytes Relative 35.1 12.0 - 46.0 %   Monocytes Relative 9.3 3.0 - 12.0 %   Eosinophils Relative 1.8 0.0 - 5.0 %   Basophils Relative 0.7 0.0 - 3.0 %   Neutro Abs 2.5 1.4 - 7.7 K/uL   Lymphs Abs 1.6 0.7 - 4.0 K/uL   Monocytes Absolute 0.4 0.1 - 1.0 K/uL   Eosinophils Absolute 0.1 0.0 - 0.7 K/uL   Basophils Absolute 0.0 0.0 - 0.1 K/uL      Patient Active Problem List   Diagnosis Date Noted  . History of shingles 11/18/2018  . Screening mammogram, encounter for 11/03/2016  . Encounter for gynecological examination (general)  (routine) without abnormal findings 11/03/2016  . Allergic rhinitis 09/19/2016  . History of cold sores 05/30/2016  . Family history of breast cancer 05/30/2015  . Pre-employment examination 12/12/2013  . Encounter for routine gynecological examination 04/16/2011  . Routine general medical examination at a health care facility 04/08/2011  . History of depression 12/05/2008  . GERD 12/05/2008   Past Medical History:  Diagnosis Date  . Anemia   . Depressive disorder, not elsewhere classified   . Esophageal reflux   . HPV-genital warts   . Pure hypercholesterolemia    Past Surgical History:  Procedure Laterality Date  . BREAST BIOPSY Right 12/19/2016   Fibrocystic changes with calcifications, coil marker  . chin implant    . COLONOSCOPY  11/19/10   normal  . FLEXIBLE SIGMOIDOSCOPY  11/19/10   normal  . RHINOPLASTY    . TONSILLECTOMY  1985   Social History   Tobacco Use  . Smoking status: Never Smoker  . Smokeless tobacco: Never Used  Substance Use Topics  . Alcohol use: Yes    Alcohol/week: 0.0 standard drinks    Comment: occasional  . Drug use: No   Family History  Problem Relation Age of Onset  . Alcohol abuse Father   . Diabetes Father   . Hyperlipidemia Father   . Hypertension Father   . Arthritis Other        parent  . Breast cancer Mother        < 50  . Depression Mother   . Arthritis Mother        Degenerative  . Colon cancer Maternal Grandfather        < 60  . Breast cancer Sister        BRACA gene    Allergies  Allergen Reactions  . Codeine     REACTION: nausea   Current Outpatient Medications on File Prior to Visit  Medication Sig Dispense Refill  . fluticasone (FLONASE) 50 MCG/ACT nasal spray PLACE 2 SPRAYS INTO BOTH NOSTRILS DAILY. 16 g 11  . Multiple Vitamin (MULTIVITAMIN) capsule Take 1 capsule by mouth daily.    Marland Kitchen  valACYclovir (VALTREX) 1000 MG tablet TAKE TWO PILLS EVERY 12 HOURS FOR 1 DAY AT ONSET OF SYMPTOMS 12 tablet 1   No current  facility-administered medications on file prior to visit.    Review of Systems  Constitutional: Negative for activity change, appetite change, fatigue, fever and unexpected weight change.  HENT: Negative for congestion, ear pain, rhinorrhea, sinus pressure and sore throat.   Eyes: Negative for pain, redness and visual disturbance.  Respiratory: Negative for cough, shortness of breath and wheezing.   Cardiovascular: Negative for chest pain and palpitations.  Gastrointestinal: Negative for abdominal pain, blood in stool, constipation and diarrhea.  Endocrine: Negative for polydipsia and polyuria.  Genitourinary: Negative for dysuria, frequency and urgency.  Musculoskeletal: Negative for arthralgias, back pain and myalgias.  Skin: Negative for pallor and rash.  Allergic/Immunologic: Negative for environmental allergies.  Neurological: Negative for dizziness, syncope and headaches.  Hematological: Negative for adenopathy. Does not bruise/bleed easily.  Psychiatric/Behavioral: Negative for decreased concentration and dysphoric mood. The patient is not nervous/anxious.        Objective:   Physical Exam Constitutional:      General: She is not in acute distress.    Appearance: Normal appearance. She is well-developed and normal weight. She is not ill-appearing or diaphoretic.  HENT:     Head: Normocephalic and atraumatic.     Right Ear: Tympanic membrane, ear canal and external ear normal.     Left Ear: Tympanic membrane, ear canal and external ear normal.     Nose: Nose normal. No congestion.     Mouth/Throat:     Mouth: Mucous membranes are moist.     Pharynx: Oropharynx is clear. No posterior oropharyngeal erythema.  Eyes:     General: No scleral icterus.    Extraocular Movements: Extraocular movements intact.     Conjunctiva/sclera: Conjunctivae normal.     Pupils: Pupils are equal, round, and reactive to light.  Neck:     Thyroid: No thyromegaly.     Vascular: No carotid bruit  or JVD.  Cardiovascular:     Rate and Rhythm: Normal rate and regular rhythm.     Pulses: Normal pulses.     Heart sounds: Normal heart sounds. No gallop.   Pulmonary:     Effort: Pulmonary effort is normal. No respiratory distress.     Breath sounds: Normal breath sounds. No wheezing.     Comments: Good air exch Chest:     Chest wall: No tenderness.  Abdominal:     General: Bowel sounds are normal. There is no distension or abdominal bruit.     Palpations: Abdomen is soft. There is no mass.     Tenderness: There is no abdominal tenderness.     Hernia: No hernia is present.  Genitourinary:    Comments: Breast exam: No mass, nodules, thickening, tenderness, bulging, retraction, inflamation, nipple discharge or skin changes noted.  No axillary or clavicular LA.              Anus appears normal w/o hemorrhoids or masses     External genitalia : nl appearance and hair distribution/no lesions     Urethral meatus : nl size, no lesions or prolapse     Urethra: no masses, tenderness or scarring    Bladder : no masses or tenderness     Vagina: nl general appearance, no discharge or  Lesions, no significant cystocele  or rectocele     Cervix: no lesions/ discharge or friability    Uterus: nl  size, contour, position, and mobility (not fixed) , non tender    Adnexa : no masses, tenderness, enlargement or nodularity       Musculoskeletal:        General: No tenderness. Normal range of motion.     Cervical back: Normal range of motion and neck supple. No rigidity. No muscular tenderness.     Right lower leg: No edema.     Left lower leg: No edema.  Lymphadenopathy:     Cervical: No cervical adenopathy.  Skin:    General: Skin is warm and dry.     Coloration: Skin is not pale.     Findings: No erythema or rash.     Comments: Solar lentigines diffusely   Neurological:     Mental Status: She is alert. Mental status is at baseline.     Cranial Nerves: No cranial  nerve deficit.     Motor: No abnormal muscle tone.     Coordination: Coordination normal.     Gait: Gait normal.     Deep Tendon Reflexes: Reflexes are normal and symmetric. Reflexes normal.  Psychiatric:        Mood and Affect: Mood normal.        Cognition and Memory: Cognition and memory normal.           Assessment & Plan:   Problem List Items Addressed This Visit      Other   Routine general medical examination at a health care facility - Primary    Reviewed health habits including diet and exercise and skin cancer prevention Reviewed appropriate screening tests for age  Also reviewed health mt list, fam hx and immunization status , as well as social and family history   See HPI Labs reviewed  Enc continued better diet/exercise  Flu shot given utd covid vaccines  Pap/exam done today        Relevant Orders   Flu Vaccine QUAD 6+ mos PF IM (Fluarix Quad PF) (Completed)   Encounter for gynecological examination (general) (routine) without abnormal findings    Routine-no abn  Pap sent  Rev menstrual hx      Relevant Orders   Cytology - PAP([facility])    Other Visit Diagnoses    Need for influenza vaccination       Relevant Orders   Flu Vaccine QUAD 6+ mos PF IM (Fluarix Quad PF) (Completed)

## 2020-05-22 NOTE — Assessment & Plan Note (Signed)
Routine-no abn  Pap sent  Rev menstrual hx

## 2020-05-22 NOTE — Assessment & Plan Note (Signed)
Reviewed health habits including diet and exercise and skin cancer prevention Reviewed appropriate screening tests for age  Also reviewed health mt list, fam hx and immunization status , as well as social and family history   See HPI Labs reviewed  Enc continued better diet/exercise  Flu shot given utd covid vaccines  Pap/exam done today

## 2020-05-29 LAB — CYTOLOGY - PAP
Comment: NEGATIVE
Comment: NEGATIVE
Diagnosis: NEGATIVE
HPV 16: NEGATIVE
HPV 18 / 45: NEGATIVE
High risk HPV: POSITIVE — AB

## 2020-08-18 ENCOUNTER — Other Ambulatory Visit: Payer: Self-pay | Admitting: Family Medicine

## 2020-08-20 NOTE — Telephone Encounter (Signed)
I know directions for med have been updated so will route to PCP for review.   CPE was on 05/22/20

## 2021-01-06 ENCOUNTER — Other Ambulatory Visit: Payer: Self-pay | Admitting: Family Medicine

## 2021-01-08 NOTE — Telephone Encounter (Signed)
Last OV was on 05/22/20, last filled 08/20/20 #12 tabs with 1 refill

## 2021-04-08 ENCOUNTER — Other Ambulatory Visit: Payer: Self-pay | Admitting: Family Medicine

## 2021-04-09 NOTE — Telephone Encounter (Signed)
Refill request Valtrex Last refill 01/08/21 #12/1 Last office visit 05/22/20  Upcoming appointment 05/24/21

## 2021-04-24 ENCOUNTER — Encounter: Payer: Self-pay | Admitting: Family Medicine

## 2021-04-24 DIAGNOSIS — Z1231 Encounter for screening mammogram for malignant neoplasm of breast: Secondary | ICD-10-CM

## 2021-05-16 ENCOUNTER — Other Ambulatory Visit: Payer: Self-pay

## 2021-05-16 ENCOUNTER — Ambulatory Visit
Admission: RE | Admit: 2021-05-16 | Discharge: 2021-05-16 | Disposition: A | Discharge status: Home / Self Care | Payer: BC Managed Care – PPO | Source: Ambulatory Visit | Attending: Family Medicine | Admitting: Family Medicine

## 2021-05-16 DIAGNOSIS — Z1231 Encounter for screening mammogram for malignant neoplasm of breast: Secondary | ICD-10-CM | POA: Insufficient documentation

## 2021-05-24 ENCOUNTER — Encounter: Payer: Self-pay | Admitting: Family Medicine

## 2022-04-09 IMAGING — MG MM DIGITAL SCREENING BILAT W/ TOMO AND CAD
8 series · 8 of 24 positions shown · non-contrast
Comparison: Previous exam(s).

CLINICAL DATA: Screening.

EXAM:
DIGITAL SCREENING BILATERAL MAMMOGRAM WITH TOMOSYNTHESIS AND CAD
TECHNIQUE: Bilateral screening digital craniocaudal and mediolateral oblique
mammograms were obtained. Bilateral screening digital breast
tomosynthesis was performed. The images were evaluated with
computer-aided detection.

[R MLO synth-2D]
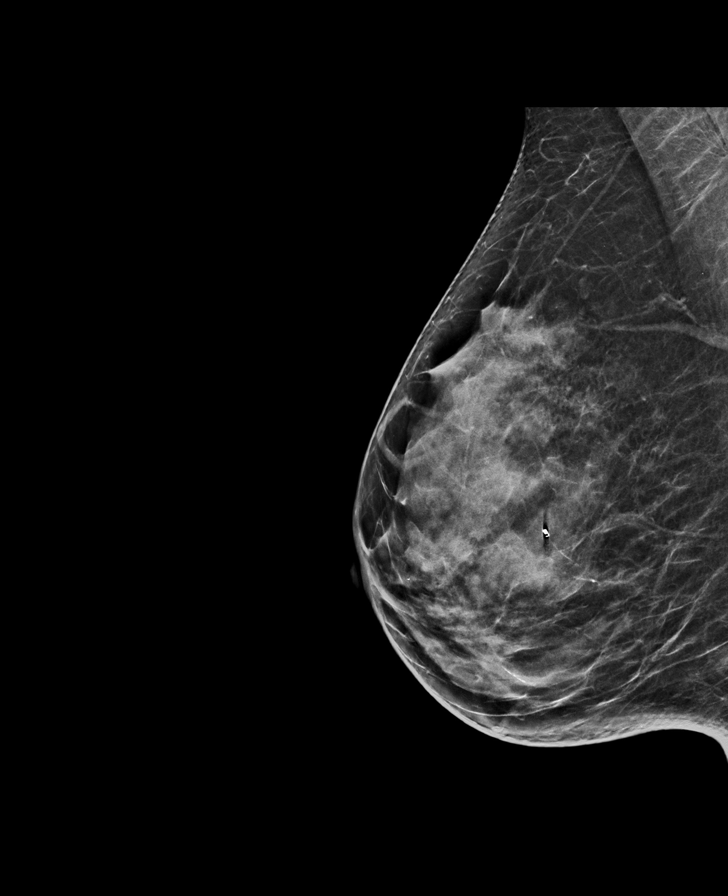

[L CC synth-2D]
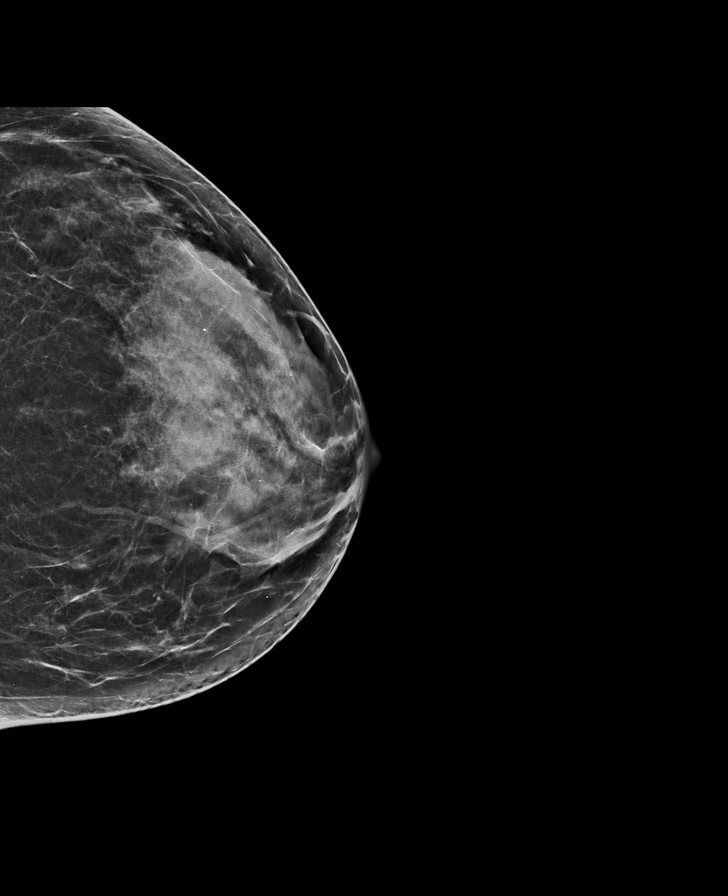

[L MLO synth-2D]
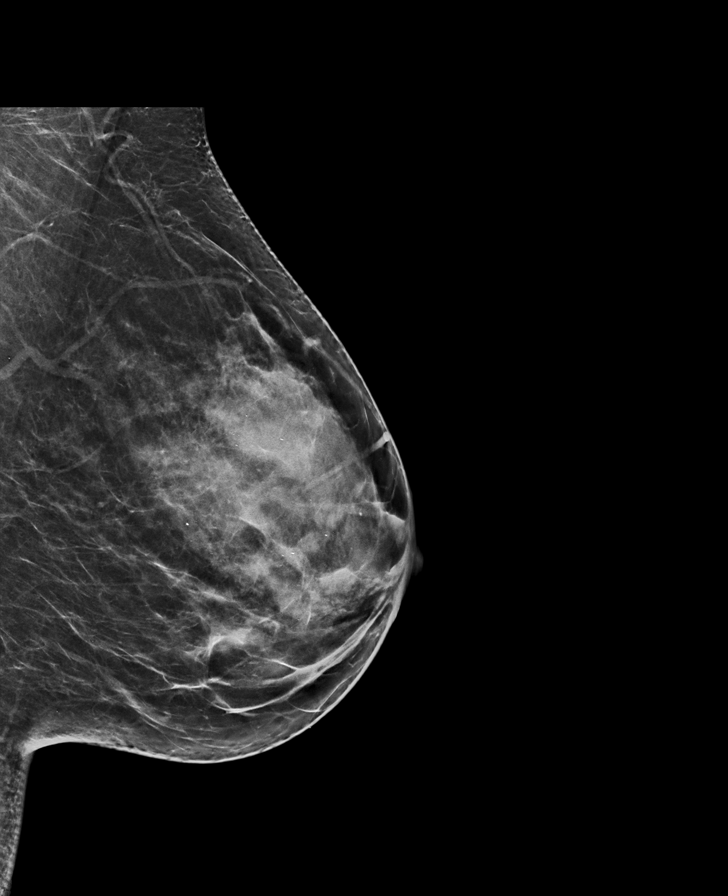

[R CC synth-2D]
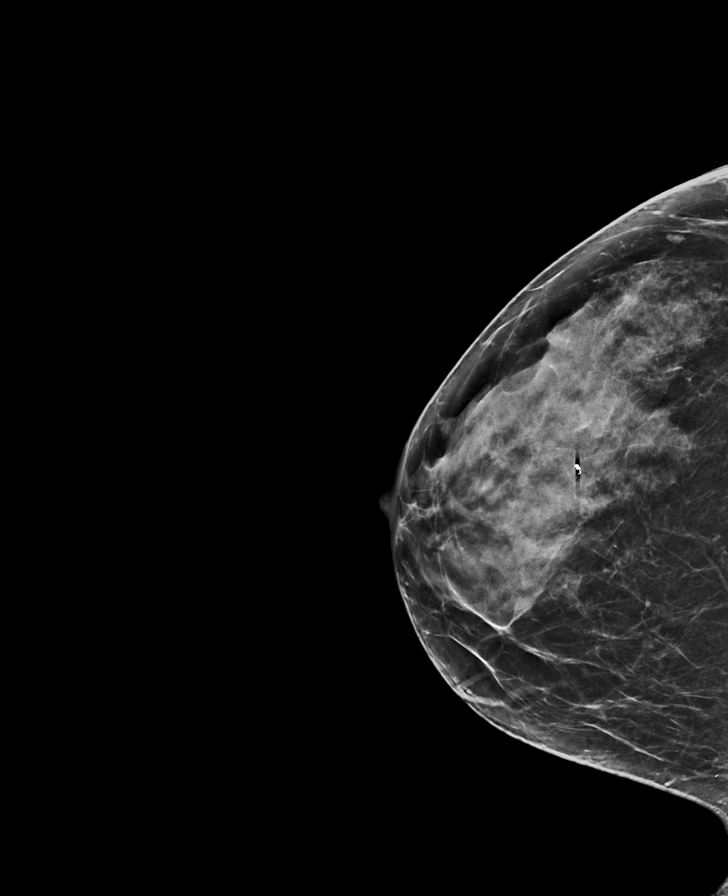

[R MLO tomo · tomo slice 33/65.0]
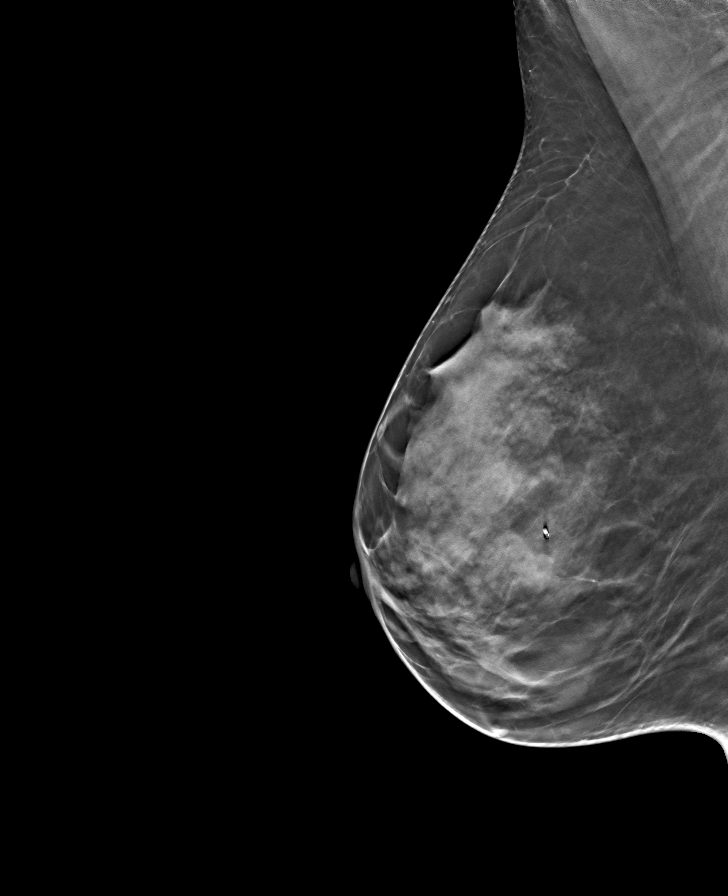

[L CC tomo · tomo slice 33/65.0]
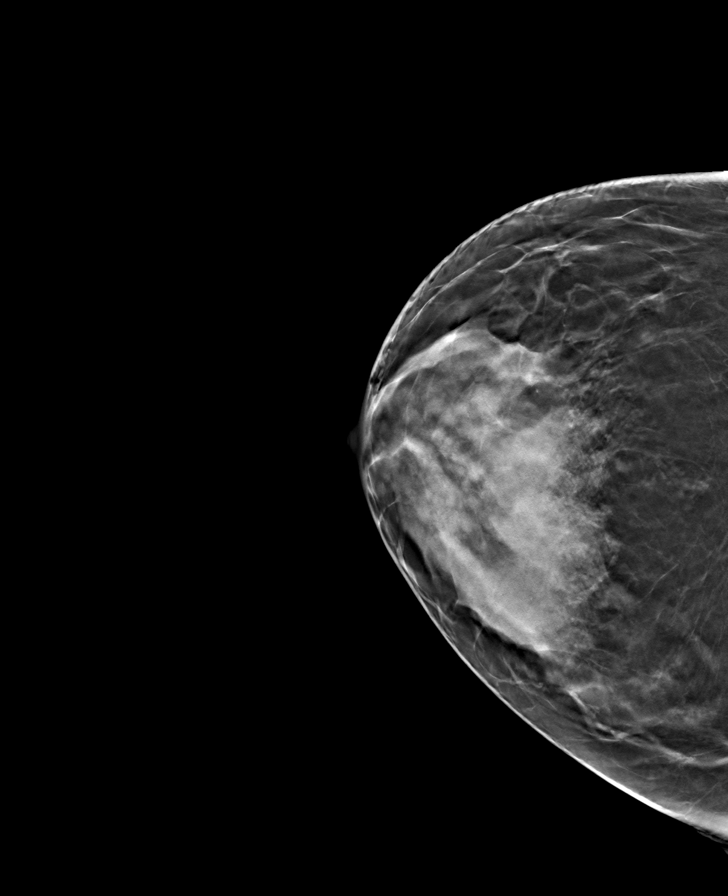

[L MLO tomo · tomo slice 34/67.0]
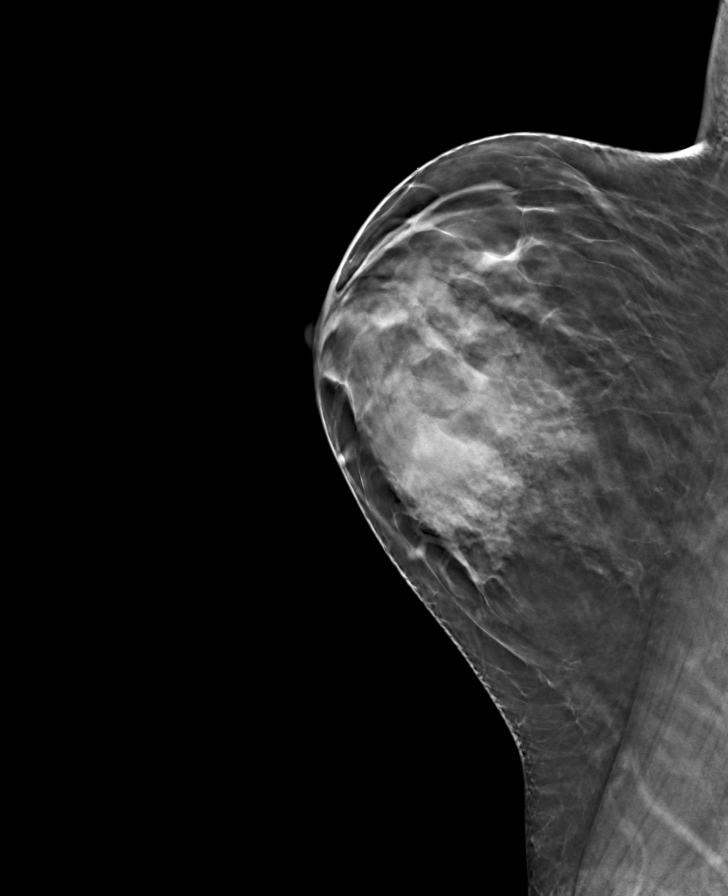

[R CC tomo · tomo slice 33/64.0]
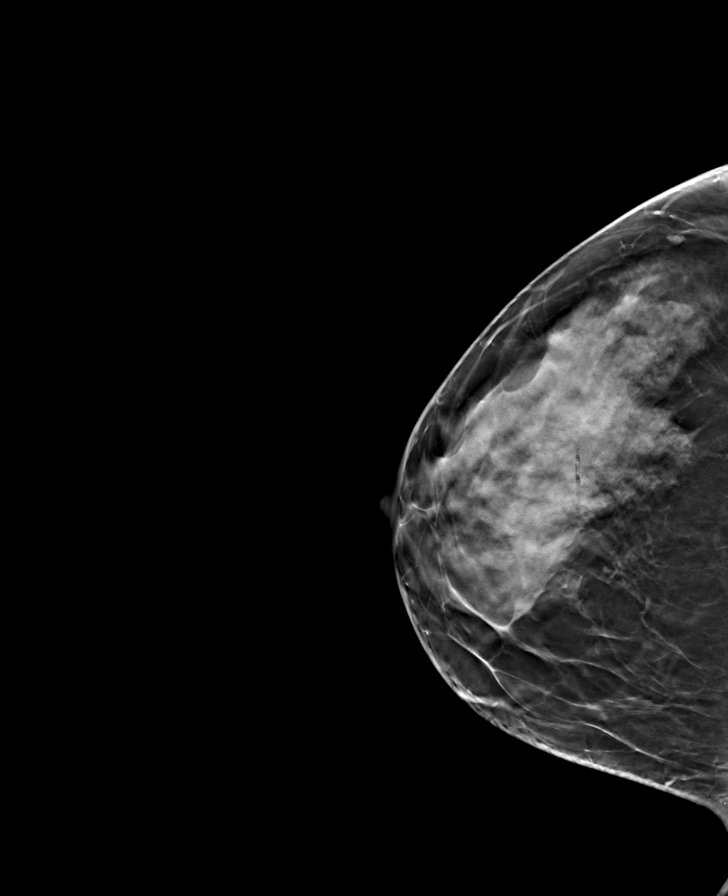

[8 of 24 positions shown; findings below may reference images not displayed]

ACR Breast Density Category d: The breast tissue is extremely dense,
which lowers the sensitivity of mammography
FINDINGS: There are no findings suspicious for malignancy.
IMPRESSION: No mammographic evidence of malignancy. A result letter of this
screening mammogram will be mailed directly to the patient.

RECOMMENDATION:
1.  Screening mammogram in one year. (Code:6T-[id number])

2. Annual high risk MRI is recommended given the patient's family
history of breast cancer in her mother and her sister, her sister
with BRCA gene mutation and due to the patient's extremely dense
breast tissue.

BI-RADS CATEGORY  1: Negative.

## 2022-05-19 ENCOUNTER — Other Ambulatory Visit: Payer: Self-pay | Admitting: Family Medicine

## 2022-05-19 DIAGNOSIS — Z1231 Encounter for screening mammogram for malignant neoplasm of breast: Secondary | ICD-10-CM

## 2022-05-20 ENCOUNTER — Ambulatory Visit
Admission: RE | Admit: 2022-05-20 | Discharge: 2022-05-20 | Disposition: A | Discharge status: Home / Self Care | Payer: BC Managed Care – PPO | Source: Ambulatory Visit | Attending: Family Medicine | Admitting: Family Medicine

## 2022-05-20 DIAGNOSIS — Z1231 Encounter for screening mammogram for malignant neoplasm of breast: Secondary | ICD-10-CM | POA: Diagnosis present

## 2023-04-28 ENCOUNTER — Other Ambulatory Visit: Payer: Self-pay | Admitting: Family Medicine

## 2023-04-28 DIAGNOSIS — Z1231 Encounter for screening mammogram for malignant neoplasm of breast: Secondary | ICD-10-CM

## 2023-05-28 ENCOUNTER — Ambulatory Visit
Admission: RE | Admit: 2023-05-28 | Discharge: 2023-05-28 | Disposition: A | Discharge status: Home / Self Care | Payer: BC Managed Care – PPO | Source: Ambulatory Visit | Attending: Family Medicine | Admitting: Family Medicine

## 2023-05-28 DIAGNOSIS — Z1231 Encounter for screening mammogram for malignant neoplasm of breast: Secondary | ICD-10-CM | POA: Diagnosis present

## 2023-10-31 LAB — COLOGUARD: COLOGUARD: NEGATIVE

## 2024-06-28 ENCOUNTER — Other Ambulatory Visit: Payer: Self-pay | Admitting: Family Medicine

## 2024-06-28 DIAGNOSIS — Z1231 Encounter for screening mammogram for malignant neoplasm of breast: Secondary | ICD-10-CM
